# Patient Record
Sex: Female | Born: 1965 | Race: White | Hispanic: No | Smoking: Current every day smoker
Health system: Southern US, Community
[De-identification: ages and names within clinical notes are randomized; demographics above are authoritative.]

## PROBLEM LIST (undated history)

## (undated) DIAGNOSIS — F32A Depression, unspecified: Secondary | ICD-10-CM

## (undated) DIAGNOSIS — IMO0001 Reserved for inherently not codable concepts without codable children: Secondary | ICD-10-CM

## (undated) DIAGNOSIS — J302 Other seasonal allergic rhinitis: Secondary | ICD-10-CM

## (undated) DIAGNOSIS — I10 Essential (primary) hypertension: Secondary | ICD-10-CM

## (undated) DIAGNOSIS — F419 Anxiety disorder, unspecified: Secondary | ICD-10-CM

## (undated) HISTORY — DX: Reserved for inherently not codable concepts without codable children: IMO0001

## (undated) HISTORY — PX: DILATION AND CURETTAGE OF UTERUS: SHX78

---

## 1998-12-19 ENCOUNTER — Encounter: Payer: Self-pay | Admitting: Emergency Medicine

## 1998-12-19 ENCOUNTER — Emergency Department (HOSPITAL_COMMUNITY): Admission: EM | Admit: 1998-12-19 | Discharge: 1998-12-19 | Payer: Self-pay | Admitting: Emergency Medicine

## 2000-10-27 ENCOUNTER — Ambulatory Visit (HOSPITAL_COMMUNITY): Admission: RE | Admit: 2000-10-27 | Discharge: 2000-10-27 | Payer: Self-pay | Admitting: Family Medicine

## 2000-10-27 ENCOUNTER — Encounter: Payer: Self-pay | Admitting: Family Medicine

## 2000-11-01 ENCOUNTER — Ambulatory Visit (HOSPITAL_COMMUNITY): Admission: RE | Admit: 2000-11-01 | Discharge: 2000-11-01 | Payer: Self-pay | Admitting: Family Medicine

## 2000-11-01 ENCOUNTER — Encounter: Payer: Self-pay | Admitting: Family Medicine

## 2000-11-25 ENCOUNTER — Ambulatory Visit (HOSPITAL_COMMUNITY): Admission: RE | Admit: 2000-11-25 | Discharge: 2000-11-25 | Payer: Self-pay | Admitting: Family Medicine

## 2000-11-25 ENCOUNTER — Encounter: Payer: Self-pay | Admitting: Family Medicine

## 2001-01-20 ENCOUNTER — Encounter: Payer: Self-pay | Admitting: Gastroenterology

## 2001-01-20 ENCOUNTER — Ambulatory Visit (HOSPITAL_COMMUNITY): Admission: RE | Admit: 2001-01-20 | Discharge: 2001-01-20 | Payer: Self-pay | Admitting: Gastroenterology

## 2001-01-24 ENCOUNTER — Ambulatory Visit (HOSPITAL_COMMUNITY): Admission: RE | Admit: 2001-01-24 | Discharge: 2001-01-24 | Payer: Self-pay | Admitting: Gastroenterology

## 2001-01-24 ENCOUNTER — Encounter: Payer: Self-pay | Admitting: Gastroenterology

## 2001-08-09 ENCOUNTER — Ambulatory Visit (HOSPITAL_COMMUNITY): Admission: RE | Admit: 2001-08-09 | Discharge: 2001-08-09 | Payer: Self-pay | Admitting: Gastroenterology

## 2001-08-09 ENCOUNTER — Encounter: Payer: Self-pay | Admitting: Gastroenterology

## 2001-08-19 ENCOUNTER — Ambulatory Visit (HOSPITAL_BASED_OUTPATIENT_CLINIC_OR_DEPARTMENT_OTHER): Admission: RE | Admit: 2001-08-19 | Discharge: 2001-08-19 | Payer: Self-pay | Admitting: Obstetrics and Gynecology

## 2002-02-13 ENCOUNTER — Ambulatory Visit (HOSPITAL_COMMUNITY): Admission: RE | Admit: 2002-02-13 | Discharge: 2002-02-13 | Payer: Self-pay | Admitting: Obstetrics and Gynecology

## 2002-02-13 ENCOUNTER — Encounter: Payer: Self-pay | Admitting: Obstetrics and Gynecology

## 2002-02-28 ENCOUNTER — Ambulatory Visit (HOSPITAL_COMMUNITY): Admission: RE | Admit: 2002-02-28 | Discharge: 2002-02-28 | Payer: Self-pay | Admitting: Obstetrics and Gynecology

## 2002-02-28 ENCOUNTER — Encounter: Payer: Self-pay | Admitting: Obstetrics and Gynecology

## 2002-06-29 HISTORY — PX: CARPAL TUNNEL RELEASE: SHX101

## 2002-07-19 ENCOUNTER — Inpatient Hospital Stay (HOSPITAL_COMMUNITY): Admission: AD | Admit: 2002-07-19 | Discharge: 2002-07-23 | Payer: Self-pay | Admitting: Obstetrics and Gynecology

## 2002-07-20 ENCOUNTER — Encounter (INDEPENDENT_AMBULATORY_CARE_PROVIDER_SITE_OTHER): Payer: Self-pay | Admitting: *Deleted

## 2002-08-31 ENCOUNTER — Other Ambulatory Visit: Admission: RE | Admit: 2002-08-31 | Discharge: 2002-08-31 | Payer: Self-pay | Admitting: Obstetrics and Gynecology

## 2003-09-03 ENCOUNTER — Other Ambulatory Visit: Admission: RE | Admit: 2003-09-03 | Discharge: 2003-09-03 | Payer: Self-pay | Admitting: Obstetrics and Gynecology

## 2004-09-03 ENCOUNTER — Other Ambulatory Visit: Admission: RE | Admit: 2004-09-03 | Discharge: 2004-09-03 | Payer: Self-pay | Admitting: Obstetrics and Gynecology

## 2005-09-11 ENCOUNTER — Other Ambulatory Visit: Admission: RE | Admit: 2005-09-11 | Discharge: 2005-09-11 | Payer: Self-pay | Admitting: Obstetrics and Gynecology

## 2006-06-22 ENCOUNTER — Emergency Department (HOSPITAL_COMMUNITY): Admission: EM | Admit: 2006-06-22 | Discharge: 2006-06-22 | Payer: Self-pay | Admitting: Emergency Medicine

## 2007-06-30 HISTORY — PX: ENDOMETRIAL ABLATION: SHX621

## 2007-07-15 ENCOUNTER — Ambulatory Visit (HOSPITAL_COMMUNITY): Admission: RE | Admit: 2007-07-15 | Discharge: 2007-07-15 | Payer: Self-pay | Admitting: Obstetrics and Gynecology

## 2007-11-25 ENCOUNTER — Encounter: Admission: RE | Admit: 2007-11-25 | Discharge: 2007-11-25 | Payer: Self-pay | Admitting: Obstetrics and Gynecology

## 2008-01-26 ENCOUNTER — Encounter: Admission: RE | Admit: 2008-01-26 | Discharge: 2008-01-26 | Payer: Self-pay | Admitting: Obstetrics and Gynecology

## 2008-01-26 ENCOUNTER — Encounter (INDEPENDENT_AMBULATORY_CARE_PROVIDER_SITE_OTHER): Payer: Self-pay | Admitting: Radiology

## 2008-07-13 ENCOUNTER — Encounter: Admission: RE | Admit: 2008-07-13 | Discharge: 2008-07-13 | Payer: Self-pay | Admitting: Obstetrics and Gynecology

## 2009-03-14 ENCOUNTER — Encounter: Admission: RE | Admit: 2009-03-14 | Discharge: 2009-03-14 | Payer: Self-pay | Admitting: Obstetrics and Gynecology

## 2010-04-25 ENCOUNTER — Encounter: Admission: RE | Admit: 2010-04-25 | Discharge: 2010-04-25 | Payer: Self-pay | Admitting: Obstetrics and Gynecology

## 2010-11-11 NOTE — Op Note (Signed)
NAMEKALLIOPE, RIESEN              ACCOUNT NO.:  [id number]   MEDICAL RECORD NO.:  [id number]          PATIENT TYPE:  AMB   LOCATION:  SDC                           FACILITY:  [facility]   PHYSICIAN:  Zenaida Niece, M.D.DATE OF BIRTH:  Oct 16, 1965   DATE OF PROCEDURE:  07/15/2007  DATE OF DISCHARGE:                               OPERATIVE REPORT   PREOPERATIVE DIAGNOSIS:  Menorrhagia.   POSTOPERATIVE DIAGNOSIS:  Menorrhagia.   PROCEDURE:  Hysteroscopy and NovaSure endometrial ablation.   SURGEON:  Zenaida Niece, M.D.   ANESTHESIA:  Monitored anesthesia care and paracervical block.   FINDINGS:  She had a normal, but large uterine cavity.  The NovaSure  device used a uterine depth of 6.5 cm, a width of 4.4 cm and used 157  watts for 70 seconds.   SPECIMENS:  None.   ESTIMATED BLOOD LOSS:  Minimal.   COMPLICATIONS:  None.   PROCEDURE IN DETAIL:  The patient was taken to the operating room and  placed in the dorsal supine position.  She was given IV sedation and  placed in mobile stirrups.  Perineum and vagina were then prepped and  draped in the usual sterile fashion and bladder drained with a latex-  free catheter.  A Graves speculum was inserted into the vagina and the  cervix well exposed.  The anterior lip of the cervix was grasped with a  single-tooth tenaculum.  Deep paracervical block was then performed with  a total of 16 mL of 2% plain lidocaine.  Uterus then sounded to 11.5 cm.  Cervix was gradually dilated to accommodate a size 19 dilator.  The  observer hysteroscope was inserted.  Suboptimal visualization was  achieved, as it was difficult to get good pressure, as fluid just kept  running out of the cervix.  However, no intrauterine lesions were seen.  The cavity looked essentially normal.  The hysteroscope was removed.  The cervix measured 5 cm with the size 7 Hegar dilator and the cervix  then easily dilated to the size 7 and then the size 8 Hegar dilator.  The  NovaSure device was then easily inserted and appropriately deployed.  CO2 test passed and endometrial ablation was performed with the above-  mentioned settings without complications.  The device was then removed  and found to be intact.  Hysteroscopy was again performed and revealed  less endometrial tissue that was slightly brownish.  Again, no  endometrial lesions were seen.  The hysteroscope was then removed.  The single-tooth tenaculum was removed from the cervix and bleeding  controlled with pressure.  All instruments were then removed from the  vagina.  The patient was taken down from stirrups.  She was taken to the  recovery room in stable condition, after tolerating the procedure well.  Counts were correct.      Zenaida Niece, M.D.  Electronically Signed     TDM/MEDQ  D:  07/15/2007  T:  07/15/2007  Job:  [id number]

## 2010-11-14 NOTE — Op Note (Signed)
NAME:  Krista Davis, Krista Davis                        ACCOUNT NO.:  [id number]   MEDICAL RECORD NO.:  [id number]                   PATIENT TYPE:  INP   LOCATION:  9325                                 FACILITY:  [facility]   PHYSICIAN:  Zenaida Niece, M.D.             DATE OF BIRTH:  22-Apr-1966   DATE OF PROCEDURE:  07/20/2002  DATE OF DISCHARGE:                                 OPERATIVE REPORT   PREOPERATIVE DIAGNOSES:  1. Intrauterine pregnancy at 39 weeks.  2. Arrest of descent.  3. Advanced maternal age.  4. Desires surgical sterility.   POSTOPERATIVE DIAGNOSES:  1. Intrauterine pregnancy at 39 weeks.  2. Arrest of descent.  3. Advanced maternal age.  4. Desires surgical sterility.   PROCEDURE:  1. Primary low transverse cesarean section.  2. Bilateral partial salpingectomy.   SURGEON:  Zenaida Niece, M.D.   ANESTHESIA:  Epidural.   ESTIMATED BLOOD LOSS:  800 mL.   FINDINGS:  The patient had normal anatomy except for the uterus was slightly  enlarged with a couple palpable fibroids.  The lower uterine segment was  fairly thick.  She delivered a viable female infant with Apgars of 8 and 9  that weighed 7 pounds 3 ounces.   PROCEDURE IN DETAIL:  The patient was taken to the operating room and placed  in the dorsal supine position with left lateral tilt.  Her previously placed  epidural was dosed appropriately.  Abdomen was prepped and draped in the  usual sterile fashion.  The level of her anesthesia was found to be adequate  and her abdomen was entered via a standard Pfannenstiel incision.  Vesicouterine peritoneum was incised and the bladder flap created digitally.  A 4 cm transverse incision was made in the fairly thick lower uterine  segment.  Once the endometrial cavity was entered, the incision was extended  bilaterally with bandage scissors.  The fetal vertex was then grasped and  delivered through the incision atraumatically.  Mouth and nares were  suctioned and  the baby was noted to be in the OP position.  A nuchal cord x1  was reduced and the remainder of the infant delivered atraumatically.  Cord  was doubly clamped and cut and the infant handed to the waiting pediatric  team.  Cord blood and segment of cord for gas were obtained and the placenta  delivered spontaneously.  The uterus was wiped dry with a clean lap pad.  All clots and debris removed.  The uterine incision was inspected and found  to be free of extensions.  It was then closed in one layer being a running  locking layer with number 1 chromic with adequate hemostasis.  Bleeding from  serosal edges was controlled with electrocautery.  A lap pad was placed over  the incision and attention was turned to the tubal ligation.   Both fallopian tubes were identified and traced to their fimbriated ends.  A  mid portion of each tube was grasped with a Babcock clamp.  A knuckle of  tube was ligated on each side with 0 plain gut suture.  The knuckle of tube  was then sharply removed.  On each side both tubal ostia were identified and  the stumps were hemostatic.  The uterine incision was again inspected and  found to be hemostatic.  The subfascial space was then irrigated and made  hemostatic with electrocautery.  Fascia was closed in a running fashion  starting at both ends and meeting in the middle with 0 Vicryl.  Subcutaneous  tissue was irrigated and made hemostatic with electrocautery.  Skin was  closed with staples and a sterile dressing.  The patient tolerated the  procedure well and was taken to the recovery room in stable condition.  Counts were correct x2 and she received Ancef 2 g after cord clamp.                                               Zenaida Niece, M.D.    TDM/MEDQ  D:  07/20/2002  T:  07/20/2002  Job:  [id number]

## 2010-11-14 NOTE — Discharge Summary (Signed)
NAME:  Krista Davis, Krista Davis                        ACCOUNT NO.:  [id number]   MEDICAL RECORD NO.:  [id number]                   PATIENT TYPE:  INP   LOCATION:  9325                                 [facility]ACILITY:  [facility]   PHYSICIAN:  Zenaida Niece, M.D.             DATE OF BIRTH:  1965-12-03   DATE OF ADMISSION:  07/19/2002  DATE OF DISCHARGE:  07/23/2002                                 DISCHARGE SUMMARY   ADMISSION DIAGNOSES:  Intrauterine pregnancy at 39 weeks. Desires surgical  sterility. Advanced maternal age.   DISCHARGE DIAGNOSES:  Intrauterine pregnancy at 39 weeks. Arrested descent.  Desired surgical sterility. Advanced maternal age.   PROCEDURE:  On July 20, 2002 she had a primary low transverse Cesarean  section with bilateral partial salpingectomy.   HISTORY OF PRESENT ILLNESS:  The patient is a 45 year old white female,  Gravida 2, Para 1/0/0/1 with an EGA of [redacted] weeks by an last menstrual period  consistent with a nine week ultrasound with a due date of July 26, 2002,  who presents with spontaneous rupture of membranes at approximately 0230 on  the day of admission. She was seen for a routine visit. Vaginal examination  in the office was 3, 50, minus two and during the examination, she had  spontaneous rupture of membranes with clear fluid. She was having occasional  contractions and some spotting and good fetal movement. Prenatal care,  advanced maternal age with an amniocentesis with 57-6 XX carrier type, carpal  tunnel syndrome.   PRENATAL LABORATORY DATA:  Blood type A+ with a negative antibody screen.  RPR non-reactive. Rubella immune. Hepatitis B surface antigen. Negative HIV.  Negative Gonorrhea and Chlamydia. Negative triple screen. One in 32 risk for  trisomy 21. One hour Glucola 177. A 3 hour GTT 85, 199, 126, and 142. Group  B strep is negative.   PAST OB HISTORY:  In 1991 vaginal delivery at 40 weeks, 7 pounds and 2  ounces. No complications.   GYN  HISTORY:  Abnormal PAP smear prior to 1991 and normal since then.   PAST MEDICAL HISTORY:  History of irritable bowel syndrome and  gastroesophageal reflux disease. History of migraine headaches.   PAST SURGICAL HISTORY:  Hysteroscopy with resection of an endometrial  nodule.   PHYSICAL EXAMINATION:  VITAL SIGNS: Afebrile with stable vital signs. Fetal  heart tracing reactive with irregular contractions.  ABDOMEN: Gravid with an estimated fetal weight of 8 pounds.  GU: Vaginal examination 3, 50, minus 2, vertex presentation and adequate  pelvis. In the hospital, I performed an amniotomy which revealed clear  fluid.   HOSPITAL COURSE:  The patient was admitted and contracted on her own. She  then required Pitocin to enter active labor. She entered active labor and  received an epidural. She progressed to complete and pushed for two hours.  Fetal heart tracing remained reassuring. She was unable to bring the baby  past +  1 station and thus had arrest of descent. She did have a temperature  to 101 just prior to delivery and received one dose of Unasyn. On the  morning of July 20, 2002, she underwent a primary low transverse Cesarean  section with tubal ligation under epidural anesthesia. Estimated blood loss  800 cc. She had normal anatomy with a thick lower uterine segment. She  delivered a viable female infant with Apgar's of 8 and 9 and weight 7 pounds  and 3 ounces. There were several small palpable myomas. Postoperative she  did very well. She was rapidly able to ambulate and tolerate a diet and  remained afebrile. She breast fed her baby without complications.  Preoperative  hemoglobin was 12.6 and postoperative 9.1. On the morning of  postoperative day three, she was felt to be stable enough for discharge to  home. At that time, her staples were removed and steri-strips applied.   DISCHARGE INSTRUCTIONS:  Regular diet, pelvic rest, and no strenuous  activity. She is given our  discharge pamphlet.   FOLLOW UP:  In approximately ten days for incision check.   MEDICATIONS:  Percocet #3 1-2 by mouth every four to six hours as needed  pain and Motrin as needed.                                               Zenaida Niece, M.D.    TDM/MEDQ  D:  07/23/2002  T:  07/24/2002  Job:  [id number]

## 2011-03-19 LAB — CBC
MCHC: 34.4
Platelets: 235
RDW: 15.3

## 2011-07-10 ENCOUNTER — Other Ambulatory Visit: Payer: Self-pay | Admitting: Obstetrics and Gynecology

## 2011-07-10 DIAGNOSIS — Z1231 Encounter for screening mammogram for malignant neoplasm of breast: Secondary | ICD-10-CM

## 2011-07-28 ENCOUNTER — Ambulatory Visit
Admission: RE | Admit: 2011-07-28 | Discharge: 2011-07-28 | Disposition: A | Discharge status: Home / Self Care | Payer: BC Managed Care – PPO | Source: Ambulatory Visit | Attending: Obstetrics and Gynecology | Admitting: Obstetrics and Gynecology

## 2011-07-28 DIAGNOSIS — Z1231 Encounter for screening mammogram for malignant neoplasm of breast: Secondary | ICD-10-CM

## 2012-08-19 ENCOUNTER — Other Ambulatory Visit: Payer: Self-pay | Admitting: Obstetrics and Gynecology

## 2012-08-19 DIAGNOSIS — Z1231 Encounter for screening mammogram for malignant neoplasm of breast: Secondary | ICD-10-CM

## 2012-09-13 ENCOUNTER — Ambulatory Visit
Admission: RE | Admit: 2012-09-13 | Discharge: 2012-09-13 | Disposition: A | Discharge status: Home / Self Care | Payer: BC Managed Care – PPO | Source: Ambulatory Visit | Attending: Obstetrics and Gynecology | Admitting: Obstetrics and Gynecology

## 2012-09-13 DIAGNOSIS — Z1231 Encounter for screening mammogram for malignant neoplasm of breast: Secondary | ICD-10-CM

## 2013-03-21 ENCOUNTER — Ambulatory Visit: Payer: Self-pay | Admitting: Family Medicine

## 2013-03-22 ENCOUNTER — Ambulatory Visit (INDEPENDENT_AMBULATORY_CARE_PROVIDER_SITE_OTHER): Payer: BC Managed Care – PPO | Admitting: Family Medicine

## 2013-03-22 ENCOUNTER — Encounter: Payer: Self-pay | Admitting: Family Medicine

## 2013-03-22 VITALS — BP 130/90 | Temp 99.0°F | Ht 60.5 in | Wt 148.4 lb

## 2013-03-22 DIAGNOSIS — M25519 Pain in unspecified shoulder: Secondary | ICD-10-CM

## 2013-03-22 DIAGNOSIS — R05 Cough: Secondary | ICD-10-CM

## 2013-03-22 DIAGNOSIS — M25511 Pain in right shoulder: Secondary | ICD-10-CM

## 2013-03-22 MED ORDER — CEFPROZIL 500 MG PO TABS: 500.0000 mg | ORAL_TABLET | Freq: Two times a day (BID) | ORAL | Status: DC

## 2013-03-22 MED ORDER — DICLOFENAC SODIUM 75 MG PO TBEC: 75.0000 mg | DELAYED_RELEASE_TABLET | Freq: Two times a day (BID) | ORAL | Status: DC

## 2013-03-22 NOTE — Progress Notes (Signed)
  Subjective:    Patient ID: Krista Davis, female    DOB: [birth year], 47 y.o.   MRN: [id number]  Cough This is a new problem. The current episode started more than 1 month ago. Associated symptoms include nasal congestion.   Would like to quit smoking. Three-quarters pack per day Cough started two three months.  Cough more aggravating in the morning.  Wakes up with congestion in the morn.  Zyrtec d takes regularly this time of yr.  Pain minimally present  indegestion intermittently.reflux at times. Nausea at times.  Increased stress at thome,  Some producitve.  Quit smoking for one yr last preg.  Several yrs quit  Pain in shoulder two to three months. ibu all day long, recalls no sudden injury.   Review of Systems  Respiratory: Positive for cough.    Also having right shoulder pain for 3 months.    Objective:   Physical Exam Alert no acute distress. HEENT slight nasal congestion. Lungs bronchial cough during exam no wheezes no crackles no tachypnea heart regular rate and rhythm. Positive nasal discharge is noted.       Assessment & Plan:  Impression subacute bronchitis discussed. #2 chronic cough 3 months and smoker. #3 shoulder pain persisting despite over-the-counter anti-inflammatories. #4 smoking cessation discussed at length. Plan 25 minutes spent most in discussion. Cefzil twice a day 10 days. Three-month course of Chantix discussed and recommended side effects benefits discussed. Chest x-ray. Shoulder x-ray. WSL

## 2013-03-23 ENCOUNTER — Ambulatory Visit (HOSPITAL_COMMUNITY)
Admission: RE | Admit: 2013-03-23 | Discharge: 2013-03-23 | Disposition: A | Discharge status: Home / Self Care | Payer: BC Managed Care – PPO | Source: Ambulatory Visit | Attending: Family Medicine | Admitting: Family Medicine

## 2013-03-23 DIAGNOSIS — R05 Cough: Secondary | ICD-10-CM | POA: Insufficient documentation

## 2013-03-23 DIAGNOSIS — R059 Cough, unspecified: Secondary | ICD-10-CM | POA: Insufficient documentation

## 2013-03-27 MED ORDER — AZITHROMYCIN 250 MG PO TABS: ORAL_TABLET | ORAL | Status: DC

## 2013-03-27 NOTE — Addendum Note (Signed)
Addended by: Margaretha Sheffield on: 03/27/2013 11:59 AM   Modules accepted: Orders

## 2013-04-18 ENCOUNTER — Telehealth: Payer: Self-pay | Admitting: Family Medicine

## 2013-04-18 DIAGNOSIS — M25511 Pain in right shoulder: Secondary | ICD-10-CM

## 2013-04-18 NOTE — Telephone Encounter (Signed)
Referral ordered in system. Patient was notified.

## 2013-04-18 NOTE — Telephone Encounter (Signed)
Let's do 

## 2013-04-18 NOTE — Addendum Note (Signed)
Addended by: Dereck Ligas on: 04/18/2013 02:41 PM   Modules accepted: Orders

## 2013-04-18 NOTE — Telephone Encounter (Signed)
Patient was in on 9/24 with shoulder pain and cough as her primary visit. But still having pain in her shoulder and wants a referral to a specialist.Days for appt referred are mon,tue,wed,thur. mornings.

## 2013-04-24 ENCOUNTER — Other Ambulatory Visit: Payer: Self-pay | Admitting: Family Medicine

## 2013-05-02 ENCOUNTER — Ambulatory Visit (INDEPENDENT_AMBULATORY_CARE_PROVIDER_SITE_OTHER): Payer: BC Managed Care – PPO | Admitting: Orthopedic Surgery

## 2013-05-02 ENCOUNTER — Encounter: Payer: Self-pay | Admitting: Orthopedic Surgery

## 2013-05-02 VITALS — BP 135/91 | Ht 60.5 in | Wt 155.0 lb

## 2013-05-02 DIAGNOSIS — M67919 Unspecified disorder of synovium and tendon, unspecified shoulder: Secondary | ICD-10-CM

## 2013-05-02 DIAGNOSIS — M75101 Unspecified rotator cuff tear or rupture of right shoulder, not specified as traumatic: Secondary | ICD-10-CM | POA: Insufficient documentation

## 2013-05-02 NOTE — Progress Notes (Signed)
  Subjective:    Patient ID: Krista Davis, female    DOB: 1965-10-25, 47 y.o.   MRN: [id number]  Shoulder Pain  The pain is present in the right shoulder. This is a new problem. The current episode started more than 1 month ago. There has been no history of extremity trauma. The problem occurs constantly. The problem has been gradually worsening. The quality of the pain is described as sharp. Associated symptoms include a limited range of motion and stiffness. Pertinent negatives include no fever, itching, joint locking or joint swelling.      Review of Systems  Constitutional: Negative.  Negative for fever.  Eyes: Negative.   Respiratory: Positive for apnea, cough and shortness of breath.   Cardiovascular: Negative.   Gastrointestinal:       Heartburn  Endocrine: Negative.   Genitourinary: Negative.   Musculoskeletal: Positive for stiffness.  Skin: Negative for itching.  Allergic/Immunologic: Positive for environmental allergies.  Neurological:       Numbness and tingling       Objective:   Physical Exam BP 135/91  Ht 5' 0.5" (1.537 m)  Wt 155 lb (70.308 kg)  BMI 29.76 kg/m2  General appearance is normal, the patient is alert and oriented x3 with normal mood and affect. Ambulation is normal  Right Shoulder Exam   Tenderness  Right shoulder tenderness location: Posterior lateral joint line.  Range of Motion  Active Abduction: abnormal  Passive Abduction: abnormal  Extension: abnormal  Forward Flexion: abnormal  External Rotation: abnormal  Internal Rotation 0 degrees: abnormal   Muscle Strength  Abduction: 5/5  Internal Rotation: 5/5  External Rotation: 5/5  Supraspinatus: 5/5  Subscapularis: 5/5  Biceps: 5/5   Tests  Apprehension: positive Cross Arm: negative Drop Arm: negative Impingement: positive Sulcus: absent  Other  Sensation: normal Pulse: present  Comments:  Cervical lymph nodes are normal bilaterally deep tendon reflexes are equal  bilaterally   Left Shoulder Exam  Left shoulder exam is normal.  Range of Motion  The patient has normal left shoulder ROM.  Muscle Strength  The patient has normal left shoulder strength.  Tests  Apprehension: negative  Other  Erythema: absent Scars: absent Sensation: normal Pulse: present       The x-ray was done prior to the office visit she has a type II acromion and otherwise normal shoulder     Assessment & Plan:   Encounter Diagnosis  Name Primary?  . Rotator cuff syndrome of right shoulder Yes    Shoulder Injection Procedure Note   Pre-operative Diagnosis: right  RC Syndrome  Post-operative Diagnosis: same  Indications: pain   Anesthesia: ethyl chloride   Procedure Details   Verbal consent was obtained for the procedure. The shoulder was prepped withalcohol and the skin was anesthetized. A 20 gauge needle was advanced into the subacromial space through posterior approach without difficulty  The space was then injected with 3 ml 1% lidocaine and 1 ml of depomedrol. The injection site was cleansed with isopropyl alcohol and a dressing was applied.  Complications:  None; patient tolerated the procedure well.   Recommend continued anti-inflammatories and had a home exercise program for rotator cuff strengthening   Followup when necessary

## 2013-05-02 NOTE — Patient Instructions (Signed)
Go to [facility] to pick up exercise bands  Joint Injection  Care After  Refer to this sheet in the next few days. These instructions provide you with information on caring for yourself after you have had a joint injection. Your caregiver also may give you more specific instructions. Your treatment has been planned according to current medical practices, but problems sometimes occur. Call your caregiver if you have any problems or questions after your procedure.  After any type of joint injection, it is not uncommon to experience:  Soreness, swelling, or bruising around the injection site.  Mild numbness, tingling, or weakness around the injection site caused by the numbing medicine used before or with the injection. It also is possible to experience the following effects associated with the specific agent after injection:  Iodine-based contrast agents:  Allergic reaction (itching, hives, widespread redness, and swelling beyond the injection site).  Corticosteroids (These effects are rare.):  Allergic reaction.  Increased blood sugar levels (If you have diabetes and you notice that your blood sugar levels have increased, notify your caregiver).  Increased blood pressure levels.  Mood swings.  Hyaluronic acid in the use of viscosupplementation.  Temporary heat or redness.  Temporary rash and itching.  Increased fluid accumulation in the injected joint. These effects all should resolve within a day after your procedure.  HOME CARE INSTRUCTIONS  Limit yourself to light activity the day of your procedure. Avoid lifting heavy objects, bending, stooping, or twisting.  Take prescription or over-the-counter pain medication as directed by your caregiver.  You may apply ice to your injection site to reduce pain and swelling the day of your procedure. Ice may be applied 3-4 times:  Put ice in a plastic bag.  Place a towel between your skin and the bag.  Leave the ice on for no longer than  15-20 minutes each time. SEEK IMMEDIATE MEDICAL CARE IF:  Pain and swelling get worse rather than better or extend beyond the injection site.  Numbness does not go away.  Blood or fluid continues to leak from the injection site.  You have chest pain.  You have swelling of your face or tongue.  You have trouble breathing or you become dizzy.  You develop a fever, chills, or severe tenderness at the injection site that last longer than 1 day. MAKE SURE YOU:  Understand these instructions.  Watch your condition.  Get help right away if you are not doing well or if you get worse. Document Released: 02/26/2011 Document Revised: 09/07/2011 Document Reviewed: 02/26/2011  [facility] Patient Information 2014 [place], [place].

## 2013-05-04 ENCOUNTER — Other Ambulatory Visit: Payer: Self-pay

## 2013-05-24 ENCOUNTER — Ambulatory Visit (INDEPENDENT_AMBULATORY_CARE_PROVIDER_SITE_OTHER): Payer: BC Managed Care – PPO | Admitting: Family Medicine

## 2013-05-24 ENCOUNTER — Encounter: Payer: Self-pay | Admitting: Family Medicine

## 2013-05-24 VITALS — BP 120/80 | Temp 97.7°F | Ht 60.5 in | Wt 157.5 lb

## 2013-05-24 DIAGNOSIS — J029 Acute pharyngitis, unspecified: Secondary | ICD-10-CM

## 2013-05-24 DIAGNOSIS — B9789 Other viral agents as the cause of diseases classified elsewhere: Secondary | ICD-10-CM

## 2013-05-24 DIAGNOSIS — J019 Acute sinusitis, unspecified: Secondary | ICD-10-CM

## 2013-05-24 DIAGNOSIS — B349 Viral infection, unspecified: Secondary | ICD-10-CM

## 2013-05-24 LAB — POCT RAPID STREP A (OFFICE): Rapid Strep A Screen: NEGATIVE

## 2013-05-24 MED ORDER — HYDROCODONE-HOMATROPINE 5-1.5 MG/5ML PO SYRP: 5.0000 mL | ORAL_SOLUTION | Freq: Four times a day (QID) | ORAL | Status: DC | PRN

## 2013-05-24 MED ORDER — CLARITHROMYCIN 500 MG PO TABS: 500.0000 mg | ORAL_TABLET | Freq: Two times a day (BID) | ORAL | Status: AC

## 2013-05-24 NOTE — Progress Notes (Signed)
   Subjective:    Patient ID: Krista Davis, female    DOB: 1965/09/22, 47 y.o.   MRN: [id number]  Sore Throat  This is a new problem. The current episode started in the past 7 days. The problem has been unchanged. Neither side of throat is experiencing more pain than the other. The maximum temperature recorded prior to her arrival was 101 - 101.9 F. The fever has been present for 1 to 2 days. The pain is at a severity of 7/10. The pain is moderate. Associated symptoms include congestion, coughing, ear pain and headaches. She has tried NSAIDs for the symptoms. The treatment provided mild relief.   Started last Thursday, had chills then fever on Friday. Body aches. Sunday pm a little better more energy. Sun night congestion Progressive Sx today with sinuses PMH prn   Review of Systems  HENT: Positive for congestion and ear pain.   Respiratory: Positive for cough.   Neurological: Positive for headaches.       Objective:   Physical Exam  Nursing note and vitals reviewed. Constitutional: She appears well-developed.  HENT:  Head: Normocephalic.  Nose: Nose normal.  Mouth/Throat: Oropharynx is clear and moist. No oropharyngeal exudate.  Neck: Neck supple.  Cardiovascular: Normal rate and normal heart sounds.   No murmur heard. Pulmonary/Chest: Effort normal and breath sounds normal. She has no wheezes.  Lymphadenopathy:    She has no cervical adenopathy.  Skin: Skin is warm and dry.          Assessment & Plan:  Viral syndrome with secondary sinusitis Biaxin as directed if high fevers progressive symptoms or worse followup

## 2013-05-25 LAB — STREP A DNA PROBE: GASP: NEGATIVE

## 2013-06-19 ENCOUNTER — Other Ambulatory Visit: Payer: Self-pay | Admitting: Orthopedic Surgery

## 2013-06-20 ENCOUNTER — Encounter (HOSPITAL_BASED_OUTPATIENT_CLINIC_OR_DEPARTMENT_OTHER): Payer: Self-pay | Admitting: *Deleted

## 2013-06-20 NOTE — Progress Notes (Signed)
No labs needed

## 2013-06-27 ENCOUNTER — Encounter (HOSPITAL_BASED_OUTPATIENT_CLINIC_OR_DEPARTMENT_OTHER)
Admission: RE | Disposition: A | Discharge status: Home / Self Care | Payer: Self-pay | Source: Ambulatory Visit | Attending: Orthopedic Surgery

## 2013-06-27 ENCOUNTER — Encounter (HOSPITAL_BASED_OUTPATIENT_CLINIC_OR_DEPARTMENT_OTHER): Payer: BC Managed Care – PPO | Admitting: Anesthesiology

## 2013-06-27 ENCOUNTER — Encounter (HOSPITAL_BASED_OUTPATIENT_CLINIC_OR_DEPARTMENT_OTHER): Payer: Self-pay | Admitting: *Deleted

## 2013-06-27 ENCOUNTER — Ambulatory Visit (HOSPITAL_BASED_OUTPATIENT_CLINIC_OR_DEPARTMENT_OTHER): Payer: BC Managed Care – PPO | Admitting: Anesthesiology

## 2013-06-27 ENCOUNTER — Ambulatory Visit (HOSPITAL_BASED_OUTPATIENT_CLINIC_OR_DEPARTMENT_OTHER)
Admission: RE | Admit: 2013-06-27 | Discharge: 2013-06-27 | Disposition: A | Discharge status: Home / Self Care | Payer: BC Managed Care – PPO | Source: Ambulatory Visit | Attending: Orthopedic Surgery | Admitting: Orthopedic Surgery

## 2013-06-27 DIAGNOSIS — G5600 Carpal tunnel syndrome, unspecified upper limb: Secondary | ICD-10-CM | POA: Insufficient documentation

## 2013-06-27 HISTORY — PX: CARPAL TUNNEL RELEASE: SHX101

## 2013-06-27 HISTORY — DX: Other seasonal allergic rhinitis: J30.2

## 2013-06-27 LAB — POCT HEMOGLOBIN-HEMACUE: Hemoglobin: 14.4 g/dL (ref 12.0–15.0)

## 2013-06-27 SURGERY — CARPAL TUNNEL RELEASE
Anesthesia: General | Site: Wrist | Laterality: Right

## 2013-06-27 MED ORDER — CHLORHEXIDINE GLUCONATE 4 % EX LIQD: 60.0000 mL | Freq: Once | CUTANEOUS | Status: DC

## 2013-06-27 MED ORDER — KETOROLAC TROMETHAMINE 30 MG/ML IJ SOLN
INTRAMUSCULAR | Status: DC | PRN
  Administered 2013-06-27: 30 mg via INTRAVENOUS

## 2013-06-27 MED ORDER — HYDROMORPHONE HCL PF 1 MG/ML IJ SOLN
0.2500 mg | INTRAMUSCULAR | Status: DC | PRN
  Administered 2013-06-27: 0.5 mg via INTRAVENOUS

## 2013-06-27 MED ORDER — LIDOCAINE HCL (CARDIAC) 20 MG/ML IV SOLN
INTRAVENOUS | Status: DC | PRN
  Administered 2013-06-27: 50 mg via INTRAVENOUS

## 2013-06-27 MED ORDER — LACTATED RINGERS IV SOLN
INTRAVENOUS | Status: DC
  Administered 2013-06-27 (×2): via INTRAVENOUS

## 2013-06-27 MED ORDER — PROPOFOL 10 MG/ML IV EMUL
INTRAVENOUS | Status: AC
  Filled 2013-06-27: qty 50

## 2013-06-27 MED ORDER — BUPIVACAINE HCL (PF) 0.25 % IJ SOLN
INTRAMUSCULAR | Status: DC | PRN
  Administered 2013-06-27: 7 mL

## 2013-06-27 MED ORDER — OXYCODONE HCL 5 MG PO TABS
5.0000 mg | ORAL_TABLET | Freq: Once | ORAL | Status: AC | PRN
  Administered 2013-06-27: 5 mg via ORAL

## 2013-06-27 MED ORDER — MIDAZOLAM HCL 2 MG/2ML IJ SOLN
INTRAMUSCULAR | Status: AC
  Filled 2013-06-27: qty 2

## 2013-06-27 MED ORDER — OXYCODONE HCL 5 MG PO TABS
ORAL_TABLET | ORAL | Status: AC
  Filled 2013-06-27: qty 1

## 2013-06-27 MED ORDER — DEXAMETHASONE SODIUM PHOSPHATE 10 MG/ML IJ SOLN
INTRAMUSCULAR | Status: DC | PRN
  Administered 2013-06-27: 5 mg via INTRAVENOUS

## 2013-06-27 MED ORDER — HYDROCODONE-ACETAMINOPHEN 5-325 MG PO TABS: 1.0000 | ORAL_TABLET | Freq: Four times a day (QID) | ORAL | Status: DC | PRN

## 2013-06-27 MED ORDER — FENTANYL CITRATE 0.05 MG/ML IJ SOLN
INTRAMUSCULAR | Status: DC | PRN
  Administered 2013-06-27 (×2): 25 ug via INTRAVENOUS
  Administered 2013-06-27: 50 ug via INTRAVENOUS

## 2013-06-27 MED ORDER — ONDANSETRON HCL 4 MG/2ML IJ SOLN
INTRAMUSCULAR | Status: DC | PRN
  Administered 2013-06-27: 4 mg via INTRAVENOUS

## 2013-06-27 MED ORDER — FENTANYL CITRATE 0.05 MG/ML IJ SOLN: 50.0000 ug | INTRAMUSCULAR | Status: DC | PRN

## 2013-06-27 MED ORDER — FENTANYL CITRATE 0.05 MG/ML IJ SOLN
INTRAMUSCULAR | Status: AC
  Filled 2013-06-27: qty 2

## 2013-06-27 MED ORDER — HYDROMORPHONE HCL PF 1 MG/ML IJ SOLN
INTRAMUSCULAR | Status: AC
  Filled 2013-06-27: qty 1

## 2013-06-27 MED ORDER — ONDANSETRON HCL 4 MG/2ML IJ SOLN: 4.0000 mg | Freq: Once | INTRAMUSCULAR | Status: DC | PRN

## 2013-06-27 MED ORDER — CEFAZOLIN SODIUM-DEXTROSE 2-3 GM-% IV SOLR
2.0000 g | INTRAVENOUS | Status: AC
  Administered 2013-06-27: 2 g via INTRAVENOUS

## 2013-06-27 MED ORDER — MIDAZOLAM HCL 5 MG/5ML IJ SOLN
INTRAMUSCULAR | Status: DC | PRN
  Administered 2013-06-27: 2 mg via INTRAVENOUS

## 2013-06-27 MED ORDER — MIDAZOLAM HCL 2 MG/2ML IJ SOLN: 1.0000 mg | INTRAMUSCULAR | Status: DC | PRN

## 2013-06-27 MED ORDER — PROPOFOL 10 MG/ML IV BOLUS
INTRAVENOUS | Status: DC | PRN
  Administered 2013-06-27: 200 mg via INTRAVENOUS

## 2013-06-27 MED ORDER — OXYCODONE HCL 5 MG/5ML PO SOLN: 5.0000 mg | Freq: Once | ORAL | Status: AC | PRN

## 2013-06-27 SURGICAL SUPPLY — 43 items
BLADE SURG 15 STRL LF DISP TIS (BLADE) ×1 IMPLANT
BLADE SURG 15 STRL SS (BLADE) ×2
BNDG CMPR 9X4 STRL LF SNTH (GAUZE/BANDAGES/DRESSINGS) ×1
BNDG COHESIVE 3X5 TAN STRL LF (GAUZE/BANDAGES/DRESSINGS) ×2 IMPLANT
BNDG ESMARK 4X9 LF (GAUZE/BANDAGES/DRESSINGS) ×1 IMPLANT
BNDG GAUZE ELAST 4 BULKY (GAUZE/BANDAGES/DRESSINGS) ×2 IMPLANT
CHLORAPREP W/TINT 26ML (MISCELLANEOUS) ×2 IMPLANT
CORDS BIPOLAR (ELECTRODE) ×2 IMPLANT
COVER MAYO STAND STRL (DRAPES) ×2 IMPLANT
COVER TABLE BACK 60X90 (DRAPES) ×2 IMPLANT
CUFF TOURNIQUET SINGLE 18IN (TOURNIQUET CUFF) ×2 IMPLANT
DRAPE EXTREMITY T 121X128X90 (DRAPE) ×2 IMPLANT
DRAPE SURG 17X23 STRL (DRAPES) ×2 IMPLANT
DRSG KUZMA FLUFF (GAUZE/BANDAGES/DRESSINGS) ×1 IMPLANT
GAUZE XEROFORM 1X8 LF (GAUZE/BANDAGES/DRESSINGS) ×2 IMPLANT
GLOVE BIO SURGEON STRL SZ 6.5 (GLOVE) ×2 IMPLANT
GLOVE BIO SURGEON STRL SZ7.5 (GLOVE) ×1 IMPLANT
GLOVE BIOGEL PI IND STRL 7.0 (GLOVE) IMPLANT
GLOVE BIOGEL PI IND STRL 8 (GLOVE) IMPLANT
GLOVE BIOGEL PI IND STRL 8.5 (GLOVE) ×1 IMPLANT
GLOVE BIOGEL PI INDICATOR 7.0 (GLOVE) ×1
GLOVE BIOGEL PI INDICATOR 8 (GLOVE) ×1
GLOVE BIOGEL PI INDICATOR 8.5 (GLOVE) ×1
GLOVE ECLIPSE 6.5 STRL STRAW (GLOVE) ×1 IMPLANT
GLOVE SURG ORTHO 8.0 STRL STRW (GLOVE) ×2 IMPLANT
GOWN BRE IMP PREV XXLGXLNG (GOWN DISPOSABLE) ×3 IMPLANT
GOWN PREVENTION PLUS XLARGE (GOWN DISPOSABLE) ×2 IMPLANT
NEEDLE 27GAX1X1/2 (NEEDLE) ×1 IMPLANT
NS IRRIG 1000ML POUR BTL (IV SOLUTION) ×2 IMPLANT
PACK BASIN DAY SURGERY FS (CUSTOM PROCEDURE TRAY) ×2 IMPLANT
PAD ABD 8X10 STRL (GAUZE/BANDAGES/DRESSINGS) ×1 IMPLANT
PAD CAST 3X4 CTTN HI CHSV (CAST SUPPLIES) ×1 IMPLANT
PADDING CAST ABS 4INX4YD NS (CAST SUPPLIES) ×1
PADDING CAST ABS COTTON 4X4 ST (CAST SUPPLIES) ×1 IMPLANT
PADDING CAST COTTON 3X4 STRL (CAST SUPPLIES) ×2
SPONGE GAUZE 4X4 12PLY (GAUZE/BANDAGES/DRESSINGS) ×2 IMPLANT
STOCKINETTE 4X48 STRL (DRAPES) ×2 IMPLANT
SUT VICRYL 4-0 PS2 18IN ABS (SUTURE) IMPLANT
SUT VICRYL RAPIDE 4/0 PS 2 (SUTURE) ×2 IMPLANT
SYR BULB 3OZ (MISCELLANEOUS) ×2 IMPLANT
SYR CONTROL 10ML LL (SYRINGE) ×1 IMPLANT
TOWEL OR 17X24 6PK STRL BLUE (TOWEL DISPOSABLE) ×2 IMPLANT
UNDERPAD 30X30 INCONTINENT (UNDERPADS AND DIAPERS) ×2 IMPLANT

## 2013-06-27 NOTE — Brief Op Note (Signed)
06/27/2013  11:31 AM  PATIENT:  Krista Davis  47 y.o. female  PRE-OPERATIVE DIAGNOSIS:  CARPAL TUNNEL SYNDROME RIGHT WRIST   POST-OPERATIVE DIAGNOSIS:  CARPAL TUNNEL SYNDROME RIGHT WRIST   PROCEDURE:  Procedure(s): RIGHT CARPAL TUNNEL RELEASE (Right)  SURGEON:  Surgeon(s) and Role:    * Nicki Reaper, MD - Primary  PHYSICIAN ASSISTANT:   ASSISTANTS: K Martinique Pizzimenti,MD   ANESTHESIA:   local and regional  EBL:  Total I/O In: 1000 [I.V.:1000] Out: -   BLOOD ADMINISTERED:none  DRAINS: none   LOCAL MEDICATIONS USED:  BUPIVICAINE   SPECIMEN:  No Specimen  DISPOSITION OF SPECIMEN:  N/A  COUNTS:  YES  TOURNIQUET:   Total Tourniquet Time Documented: Upper Arm (Right) - 11 minutes Total: Upper Arm (Right) - 11 minutes   DICTATION: .Other Dictation: Dictation Number [id number]  PLAN OF CARE: Discharge to home after PACU  PATIENT DISPOSITION:  PACU - hemodynamically stable.

## 2013-06-27 NOTE — Transfer of Care (Signed)
Immediate Anesthesia Transfer of Care Note  Patient: Krista Davis  Procedure(s) Performed: Procedure(s): RIGHT CARPAL TUNNEL RELEASE (Right)  Patient Location: PACU  Anesthesia Type:General  Level of Consciousness: sedated  Airway & Oxygen Therapy: Patient Spontanous Breathing and Patient connected to face mask oxygen  Post-op Assessment: Report given to PACU RN and Post -op Vital signs reviewed and stable  Post vital signs: Reviewed and stable  Complications: No apparent anesthesia complications

## 2013-06-27 NOTE — Op Note (Signed)
Dictation Number [id number]

## 2013-06-27 NOTE — H&P (Signed)
Krista Davis is a 47 year-old right-hand dominant female, daughter of a former patient. She has had bilateral carpal tunnel syndromes diagnosed ten years ago by Dr. Amanda Pea. She underwent release of her left side after nerve conductions showed a severe carpal tunnel syndrome on that side. She is now complaining of her right side which has not been treated.  She has no history of injury to the hand or neck. She is awakened 2-3 out of 7 nights.  She has no history of diabetes, thyroid problems, arthritis or gout . She has been taking ibuprofen and wearing a brace . She complains of an intermittent almost constant numbness.  She states it is gradually getting worse.  Activity makes it worse.  She has been taking only an occasional ibuprofen. She has had her nerve conductions done revealing right carpal tunnel syndrome with motor delay of 5.0, sensory delay of 2.0, amplitude diminution to 32. Her left side is entirely normal. We have discussed with her the possibility of injection to the carpal canal in an effort to see if this will settle her symptoms down for her.   ALLERGIES:   None.  MEDICATIONS:  Estradiol.  SURGICAL HISTORY:   C-section, carpal tunnel release on her left side.  FAMILY MEDICAL HISTORY:  Negative.  SOCIAL HISTORY:  She does not smoke, quit a month ago.  She drinks socially.  She is married.  [employer] operational officer.  REVIEW OF SYSTEMS:   Positive for glasses, hearing loss, otherwise negative 14 points.  Krista Davis is an 47 y.o. female.   Chief Complaint: CTS Right HPI: see above  Past Medical History  Diagnosis Date  . No abnormality seen   . Seasonal allergies     Past Surgical History  Procedure Laterality Date  . Carpal tunnel release  2004    left  . Cesarean section  2004  . Endometrial ablation  2009  . Dilation and curettage of uterus      History reviewed. No pertinent family history. Social History:  reports that she has been smoking.  She does not have any smokeless  tobacco history on file. She reports that she drinks alcohol. She reports that she does not use illicit drugs.  Allergies: No Known Allergies  Medications Prior to Admission  Medication Sig Dispense Refill  . acetaminophen (TYLENOL) 500 MG tablet Take 500 mg by mouth every 6 (six) hours as needed.      Marland Kitchen estradiol-norethindrone (ACTIVELLA) 1-0.5 MG per tablet Take 1 tablet by mouth daily.      . cetirizine (ZYRTEC) 10 MG tablet Take 10 mg by mouth as needed for allergies.        Results for orders placed during the hospital encounter of 06/27/13 (from the past 48 hour(s))  POCT HEMOGLOBIN-HEMACUE     Status: None   Collection Time    06/27/13 10:07 AM      Result Value Range   Hemoglobin 14.4  12.0 - 15.0 g/dL    No results found.   Pertinent items are noted in HPI.  Blood pressure 137/84, pulse 86, temperature 98.3 F (36.8 C), temperature source Oral, resp. rate 18, height 5' (1.524 m), weight 159 lb 2 oz (72.179 kg), SpO2 98.00%.  General appearance: alert, cooperative and appears stated age Head: Normocephalic, without obvious abnormality Neck: no JVD Resp: clear to auscultation bilaterally Cardio: regular rate and rhythm, S1, S2 normal, no murmur, click, rub or gallop GI: soft, non-tender; bowel sounds normal; no masses,  no organomegaly  Extremities: extremities normal, atraumatic, no cyanosis or edema Pulses: 2+ and symmetric Skin: Skin color, texture, turgor normal. No rashes or lesions Neurologic: Grossly normal Incision/Wound: na  Assessment/Plan RADIOGRAPHS:    X-rays reveal mild degenerative change at the [facility] joint of the thumb, asymptomatic.  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    We have discussed with her the possibility of injection to the carpal canal in an effort to see if this will settle her symptoms down for her. She has declined and would like to have this operated on. The pre, peri and post op  course are discussed along with risks and complications.  She is aware there is no guarantee with surgery, possibility of infection, recurrence, injury to arteries, nerves and tendons, incomplete relief of symptoms and dystrophy.  She is well aware of risks and complications having had the left side done. She is scheduled for right carpal tunnel release as an outpatient under regional anesthesia.   Krista Davis R 06/27/2013, 10:52 AM

## 2013-06-27 NOTE — Anesthesia Procedure Notes (Signed)
Procedure Name: LMA Insertion Performed by: Kaj Vasil W Pre-anesthesia Checklist: Patient identified, Timeout performed, Emergency Drugs available, Suction available and Patient being monitored Patient Re-evaluated:Patient Re-evaluated prior to inductionOxygen Delivery Method: Circle system utilized Preoxygenation: Pre-oxygenation with 100% oxygen Intubation Type: IV induction Ventilation: Mask ventilation without difficulty LMA: LMA inserted LMA Size: 4.0 Number of attempts: 1 Placement Confirmation: breath sounds checked- equal and bilateral and positive ETCO2 Tube secured with: Tape Dental Injury: Teeth and Oropharynx as per pre-operative assessment      

## 2013-06-27 NOTE — Anesthesia Preprocedure Evaluation (Signed)
Anesthesia Evaluation  Patient identified by MRN, date of birth, ID band Patient awake    Reviewed: Allergy & Precautions, H&P , NPO status , Patient's Chart, lab work & pertinent test results  Airway Mallampati: I TM Distance: >3 FB Neck ROM: Full    Dental  (+) Teeth Intact and Dental Advisory Given   Pulmonary Current Smoker,  breath sounds clear to auscultation        Cardiovascular Rhythm:Regular     Neuro/Psych    GI/Hepatic   Endo/Other    Renal/GU      Musculoskeletal   Abdominal   Peds  Hematology   Anesthesia Other Findings   Reproductive/Obstetrics                           Anesthesia Physical Anesthesia Plan  ASA: I  Anesthesia Plan: General   Post-op Pain Management:    Induction: Intravenous  Airway Management Planned:   Additional Equipment:   Intra-op Plan:   Post-operative Plan: Extubation in OR  Informed Consent: I have reviewed the patients History and Physical, chart, labs and discussed the procedure including the risks, benefits and alternatives for the proposed anesthesia with the patient or authorized representative who has indicated his/her understanding and acceptance.   Dental advisory given  Plan Discussed with: CRNA, Anesthesiologist and Surgeon  Anesthesia Plan Comments:         Anesthesia Quick Evaluation

## 2013-06-27 NOTE — Anesthesia Postprocedure Evaluation (Signed)
  Anesthesia Post-op Note  Patient: Krista Davis  Procedure(s) Performed: Procedure(s): RIGHT CARPAL TUNNEL RELEASE (Right)  Patient Location: PACU  Anesthesia Type:General  Level of Consciousness: awake, alert  and oriented  Airway and Oxygen Therapy: Patient Spontanous Breathing  Post-op Pain: mild  Post-op Assessment: Post-op Vital signs reviewed  Post-op Vital Signs: Reviewed  Complications: No apparent anesthesia complications

## 2013-06-28 NOTE — Op Note (Signed)
NAMEBRENNAH, Krista Davis              ACCOUNT NO.:  [id number]  MEDICAL RECORD NO.:  [id number]  LOCATION:                                 FACILITY:  PHYSICIAN:  Cindee Salt, M.D.       DATE OF BIRTH:  21-Feb-1966  DATE OF PROCEDURE:  06/27/2013 DATE OF DISCHARGE:                              OPERATIVE REPORT   PREOPERATIVE DIAGNOSIS:  Carpal tunnel syndrome, right hand.  POSTOPERATIVE DIAGNOSIS:  Carpal tunnel syndrome, right hand.  OPERATION:  Decompression right median nerve.  SURGEON:  Betha Loa MD.  ANESTHESIA:  Forearm based IV regional with local infiltration.  ANESTHESIOLOGIST:  Sheldon Silvan, MD.  HISTORY:  The patient is a 47 year old female with a history of bilateral carpal tunnel.  She has undergone release of the left side in the past and is admitted now for release of the right side. Pre, peri and postoperative course have been discussed along with risks and complications.  He is aware that there is no guarantee with the surgery, possibility of infection, recurrence of injury to arteries, nerves, tendons, incomplete relief of symptoms, and dystrophy.  In the preoperative area the patient is seen, the extremity marked by both patient and surgeon.  Antibiotic given.  DESCRIPTION OF PROCEDURE:  The patient was brought to the operating room, where a forearm-based IV regional anesthetic was carried out without difficulty.  She was prepped using ChloraPrep, supine position, right arm free.  A 3-minute dry time was allowed.  Time-out taken, confirming the patient and procedure.  A longitudinal incision was made in the right palm, carried down through subcutaneous tissue.  Bleeders were electrocauterized.  Palmar fascia was split.  Superficial palmar arch identified.  The flexor tendon to the ring and little finger identified.  To the ulnar side of the median nerve, the carpal retinaculum was incised with sharp dissection.  Right angle and Sewall retractor were placed  between skin and forearm fascia.  The fascia was released.  for approximately 1 and 1.5 to 2 cm in proximal direction under direct vision.  Canal was explored.  Area of compression to the nerve was apparent.  The motor branch entered in the muscle.  No further lesions were identified.  The wound was irrigated.  The skin was then closed with interrupted 4-0 Vicryl Rapide sutures.  Local infiltration with 0.25% Marcaine without epinephrine was given, 7 mL was used.  A sterile compressive dressing was applied with the fingers free. On deflation of the tourniquet, all fingers immediately pinked.  She was taken to the recovery room for observation in satisfactory condition. She will be discharged home to return to the [facility] in 1 week on Vicodin.          ______________________________ Cindee Salt, M.D.     GK/MEDQ  D:  06/27/2013  T:  06/28/2013  Job:  [id number]

## 2013-06-30 ENCOUNTER — Encounter (HOSPITAL_BASED_OUTPATIENT_CLINIC_OR_DEPARTMENT_OTHER): Payer: Self-pay | Admitting: Orthopedic Surgery

## 2013-09-15 ENCOUNTER — Other Ambulatory Visit: Payer: Self-pay

## 2013-09-15 DIAGNOSIS — Z1231 Encounter for screening mammogram for malignant neoplasm of breast: Secondary | ICD-10-CM

## 2013-10-06 ENCOUNTER — Ambulatory Visit
Admission: RE | Admit: 2013-10-06 | Discharge: 2013-10-06 | Disposition: A | Discharge status: Home / Self Care | Payer: BC Managed Care – PPO | Source: Ambulatory Visit

## 2013-10-06 DIAGNOSIS — Z1231 Encounter for screening mammogram for malignant neoplasm of breast: Secondary | ICD-10-CM

## 2015-03-11 ENCOUNTER — Other Ambulatory Visit: Payer: Self-pay

## 2015-03-11 DIAGNOSIS — Z1231 Encounter for screening mammogram for malignant neoplasm of breast: Secondary | ICD-10-CM

## 2015-03-27 ENCOUNTER — Ambulatory Visit
Admission: RE | Admit: 2015-03-27 | Discharge: 2015-03-27 | Disposition: A | Discharge status: Home / Self Care | Payer: BLUE CROSS/BLUE SHIELD | Source: Ambulatory Visit

## 2015-03-27 DIAGNOSIS — Z1231 Encounter for screening mammogram for malignant neoplasm of breast: Secondary | ICD-10-CM

## 2015-04-01 ENCOUNTER — Ambulatory Visit (INDEPENDENT_AMBULATORY_CARE_PROVIDER_SITE_OTHER): Payer: BLUE CROSS/BLUE SHIELD | Admitting: Family Medicine

## 2015-04-01 ENCOUNTER — Encounter: Payer: Self-pay | Admitting: Family Medicine

## 2015-04-01 VITALS — BP 104/80 | Temp 98.2°F | Ht 60.5 in | Wt 161.2 lb

## 2015-04-01 DIAGNOSIS — M5442 Lumbago with sciatica, left side: Secondary | ICD-10-CM

## 2015-04-01 MED ORDER — NABUMETONE 750 MG PO TABS: 750.0000 mg | ORAL_TABLET | Freq: Two times a day (BID) | ORAL | Status: DC

## 2015-04-01 MED ORDER — HYDROCODONE-ACETAMINOPHEN 5-325 MG PO TABS
1.0000 | ORAL_TABLET | Freq: Every evening | ORAL | Status: DC | PRN
Start: 2015-04-01 — End: 2015-10-17

## 2015-04-01 NOTE — Progress Notes (Signed)
   Subjective:    Patient ID: Krista Davis, female    DOB: [birth year], 49 y.o.   MRN: [id number]  Back Pain This is a new problem. The current episode started in the past 7 days. The problem occurs constantly. The problem has been gradually worsening since onset. The pain is present in the lumbar spine. The quality of the pain is described as aching. The pain radiates to the left thigh and left knee. The pain is moderate. The pain is the same all the time. Stiffness is present all day. She has tried NSAIDs for the symptoms. The treatment provided mild relief.   Patient has no other concerns at this time.   Recent wks incr lifting  starte dfeeling a twinge last tue or wed  Thru the weekend got prety  Bad, hurting ea morn with intense pain  Pain goes doewn into the lower leg, vey sharp and throbbing  Eases off during day, wakes up with it  Pain mainly down left leg,  Taking four ibuprofen three times pe day, then does not need as much in the aft  Some lifting at work and working at home    Review of Systems  Musculoskeletal: Positive for back pain.   no headache no chest pain no change in urinary or bowel habits     Objective:   Physical Exam   Alert vitals stable. Lungs clear. Heart regular in rhythm no spinal tenderness no lumbar tenderness negative straight leg raise     Assessment & Plan:  Impression probable lumbar strain with secondary nerve root irritation discussed plan Relafen twice a day 21 days hydrocodone daily at bedtime stretching exercises encouraged. Hold off on MRI for now rationale discussed WSL multiple questions answered 25 minutes spent most in discussion

## 2015-05-02 ENCOUNTER — Other Ambulatory Visit: Payer: Self-pay | Admitting: Family Medicine

## 2015-10-17 ENCOUNTER — Ambulatory Visit (INDEPENDENT_AMBULATORY_CARE_PROVIDER_SITE_OTHER): Payer: BLUE CROSS/BLUE SHIELD | Admitting: Family Medicine

## 2015-10-17 ENCOUNTER — Encounter: Payer: Self-pay | Admitting: Family Medicine

## 2015-10-17 VITALS — BP 128/86 | Temp 98.6°F | Ht 60.5 in | Wt 160.0 lb

## 2015-10-17 DIAGNOSIS — H6503 Acute serous otitis media, bilateral: Secondary | ICD-10-CM | POA: Diagnosis not present

## 2015-10-17 MED ORDER — CLARITHROMYCIN 500 MG PO TABS: 500.0000 mg | ORAL_TABLET | Freq: Two times a day (BID) | ORAL | Status: DC

## 2015-10-17 MED ORDER — VARENICLINE TARTRATE 1 MG PO TABS: 1.0000 mg | ORAL_TABLET | Freq: Two times a day (BID) | ORAL | Status: DC

## 2015-10-17 MED ORDER — VARENICLINE TARTRATE 0.5 MG X 11 & 1 MG X 42 PO MISC: ORAL | Status: DC

## 2015-10-17 NOTE — Progress Notes (Signed)
   Subjective:    Patient ID: Krista Davis, female    DOB: [birth year], 50 y.o.   MRN: [id number]  Sinusitis This is a new problem. Episode onset: 3 days  Associated symptoms include ear pain. Treatments tried: sinus meds.   STARTED WITH CONGESTION SEVERAL DAYS AGO  GETTING WORSE AND WORSE SINUS MED   HELPED SOME AND THEN IT REFLARED    Hx of sinus headache  Ears first with pressure and slight    Review of Systems  HENT: Positive for ear pain.    No abdominal pain no chest pain no cough    Objective:   Physical Exam  ,rp Alert, mild malaise. Hydration good Vitals stable. frontal/ maxillary tenderness evident positive nasal congestion. TMs retracted with slight effusion bilateral pharynx normal neck supple  lungs clear/no crackles or wheezes. heart regular in rhythm       Assessment & Plan:  Impression rhinosinusitis likely post allergenic with element of bilateral serous otitis media, discussed with patient. plan antibiotics prescribed. Questions answered. Symptomatic care discussed. warning signs discussed. WSL

## 2016-02-29 IMAGING — MG MM DIGITAL SCREENING BILAT
4 series · 4 of 4 positions shown · non-contrast
Comparison: Previous exam(s).

CLINICAL DATA: Screening.

EXAM:
DIGITAL SCREENING BILATERAL MAMMOGRAM WITH CAD

[R CC]
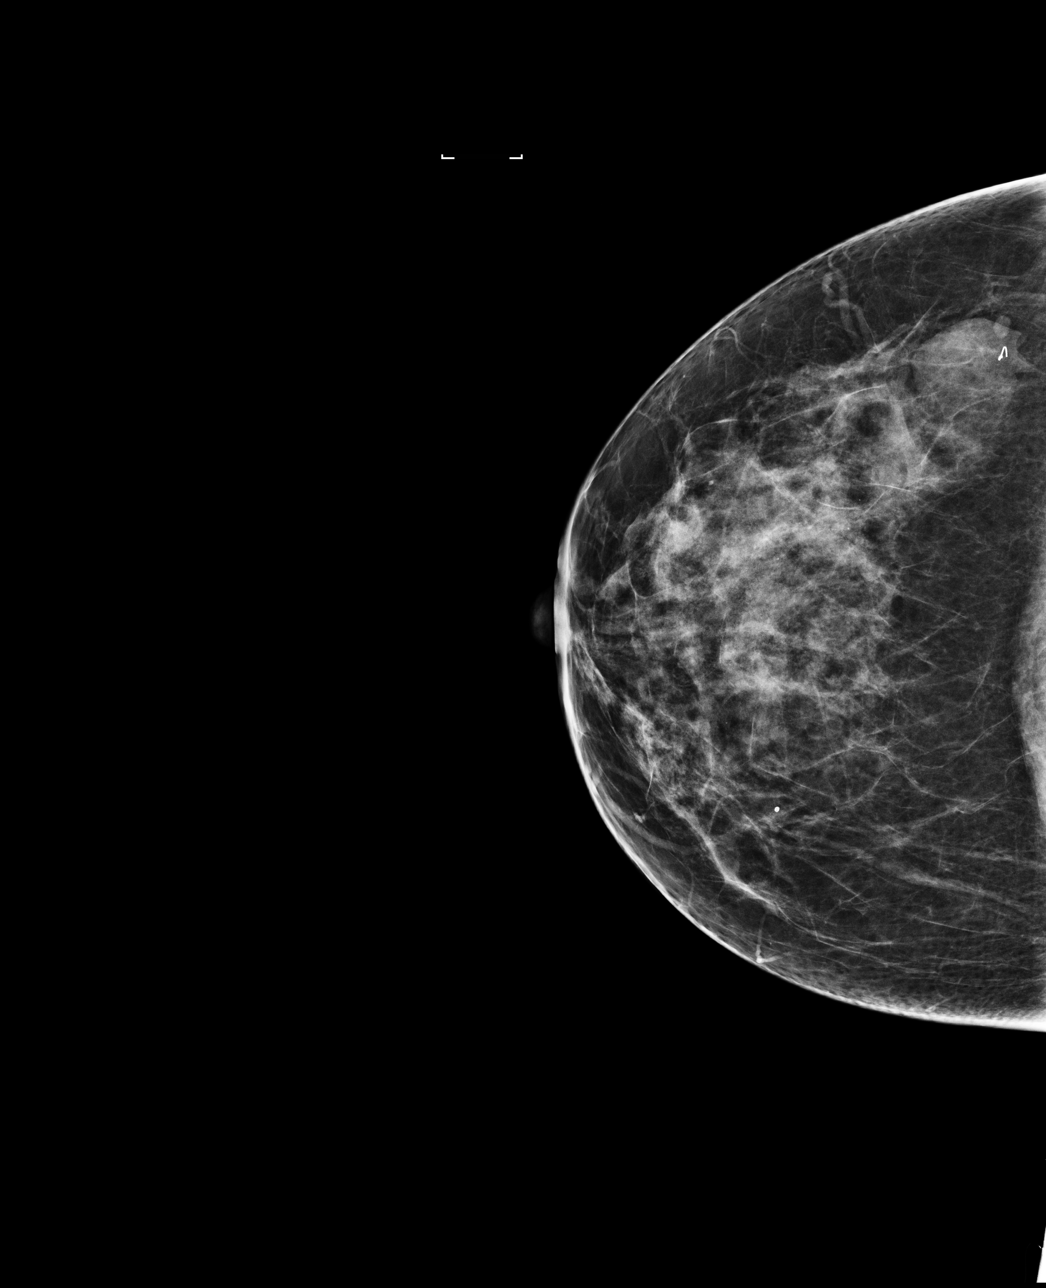

[L CC]
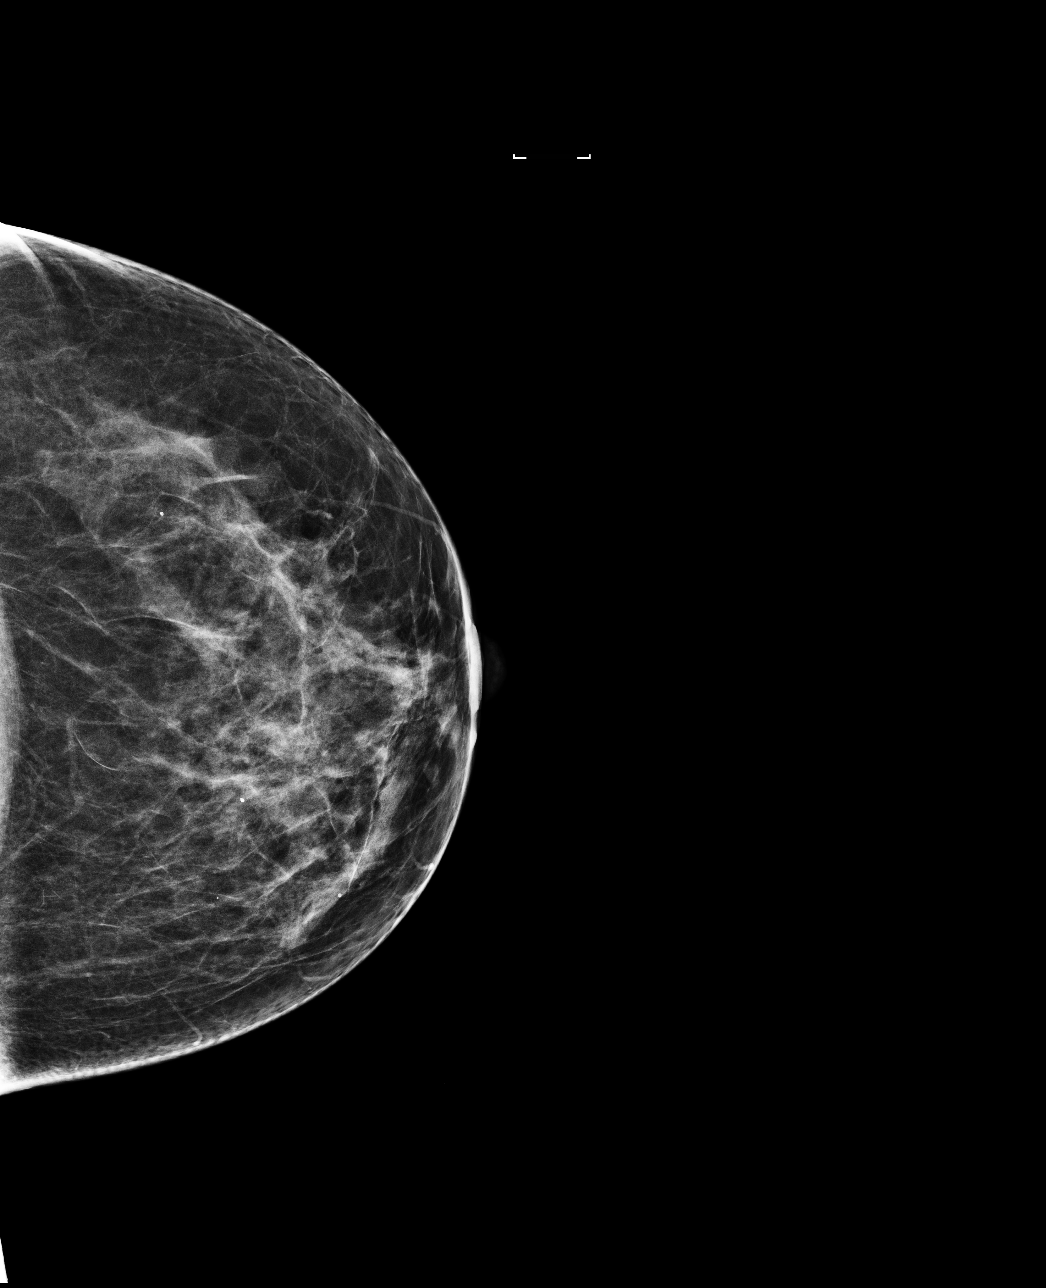

[R MLO]
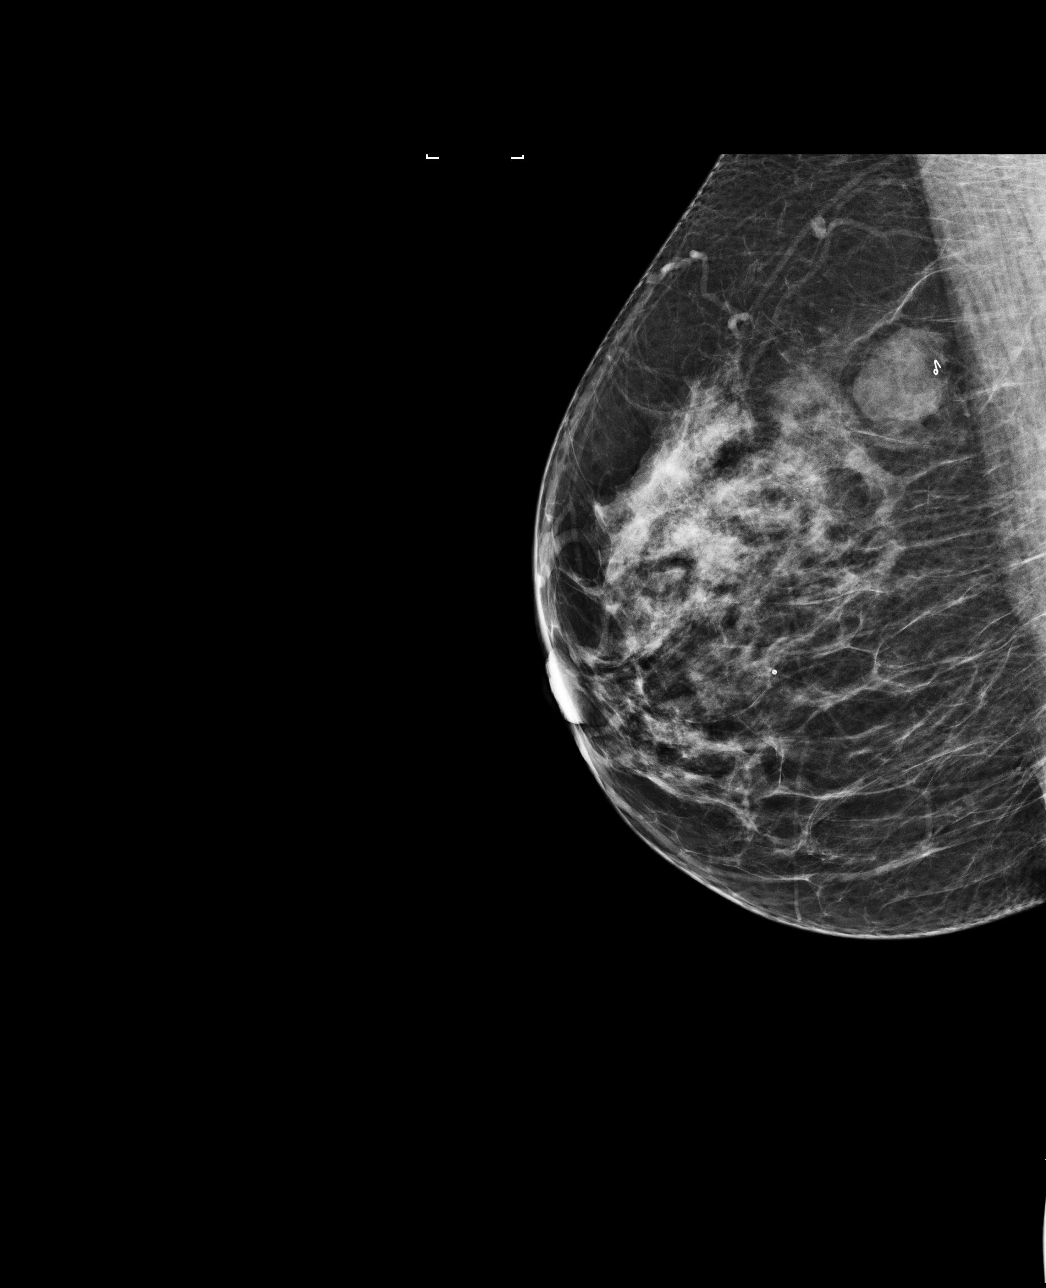

[L MLO]
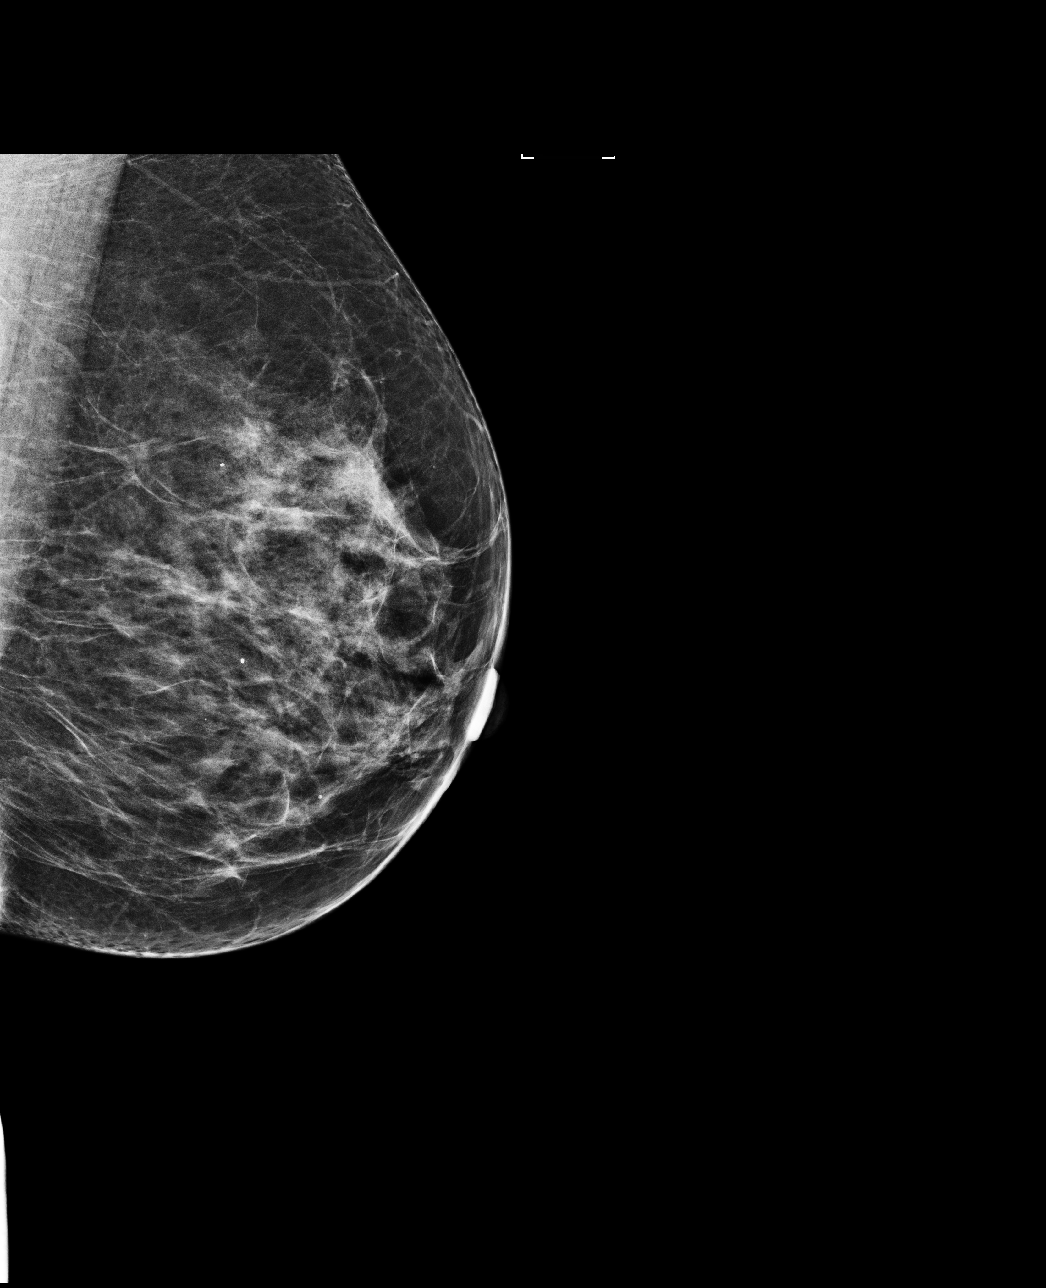

[4 of 4 positions shown; findings below may reference images not displayed]

ACR Breast Density Category c: The breast tissue is heterogeneously
dense, which may obscure small masses.
FINDINGS: There are no findings suspicious for malignancy. Images were
processed with CAD.
IMPRESSION: No mammographic evidence of malignancy. A result letter of this
screening mammogram will be mailed directly to the patient.

RECOMMENDATION:
Screening mammogram in one year. (Code:YJ-2-FEZ)

BI-RADS CATEGORY  1: Negative.

## 2016-04-02 ENCOUNTER — Ambulatory Visit (INDEPENDENT_AMBULATORY_CARE_PROVIDER_SITE_OTHER): Payer: BLUE CROSS/BLUE SHIELD | Admitting: Family Medicine

## 2016-04-02 ENCOUNTER — Encounter: Payer: Self-pay | Admitting: Family Medicine

## 2016-04-02 VITALS — BP 132/84 | Temp 98.5°F | Ht 60.5 in | Wt 166.8 lb

## 2016-04-02 DIAGNOSIS — L237 Allergic contact dermatitis due to plants, except food: Secondary | ICD-10-CM

## 2016-04-02 DIAGNOSIS — L03116 Cellulitis of left lower limb: Secondary | ICD-10-CM | POA: Diagnosis not present

## 2016-04-02 MED ORDER — CEPHALEXIN 500 MG PO CAPS: 500.0000 mg | ORAL_CAPSULE | Freq: Four times a day (QID) | ORAL | 0 refills | Status: DC

## 2016-04-02 MED ORDER — DOXYCYCLINE HYCLATE 100 MG PO TABS: 100.0000 mg | ORAL_TABLET | Freq: Two times a day (BID) | ORAL | 0 refills | Status: DC

## 2016-04-02 MED ORDER — PREDNISONE 20 MG PO TABS: ORAL_TABLET | ORAL | 0 refills | Status: DC

## 2016-04-02 NOTE — Progress Notes (Signed)
   Subjective:    Patient ID: Krista Davis, female    DOB: 09-29-1965, 50 y.o.   MRN: [id number]  HPI Patient arrives with c/o poison oak on legs-believes a place on her leg has become infected.  patient was working outside got exposed to [place] has a lot of itching rashes as been present ever since this is then progressively worse. No other particular troubles. PMH benign.  Review of Systems   Tenderness around the rash on the left thigh region also some redness around a couple other areas rash itches intensely denies fever vomiting chills    Objective:   Physical Exam  multiple areas of restart dermatitis but one in particular on the left eye has associated cellulitis redness and tenderness consistent with cellulitis and infection       Assessment & Plan:   contact dermatitis Localized cellulitis  antibiotics to cover for staph as well as MRSA   prednisone for the contact dermatitis Follow-up if progressive troubles -warning signs were discussed in detail

## 2016-07-10 DIAGNOSIS — H18832 Recurrent erosion of cornea, left eye: Secondary | ICD-10-CM | POA: Diagnosis not present

## 2016-07-20 DIAGNOSIS — H18832 Recurrent erosion of cornea, left eye: Secondary | ICD-10-CM | POA: Diagnosis not present

## 2016-08-13 DIAGNOSIS — H18832 Recurrent erosion of cornea, left eye: Secondary | ICD-10-CM | POA: Diagnosis not present

## 2016-08-19 DIAGNOSIS — H16002 Unspecified corneal ulcer, left eye: Secondary | ICD-10-CM | POA: Diagnosis not present

## 2016-08-19 DIAGNOSIS — H04122 Dry eye syndrome of left lacrimal gland: Secondary | ICD-10-CM | POA: Diagnosis not present

## 2016-09-02 DIAGNOSIS — H16002 Unspecified corneal ulcer, left eye: Secondary | ICD-10-CM | POA: Diagnosis not present

## 2016-09-02 DIAGNOSIS — H04122 Dry eye syndrome of left lacrimal gland: Secondary | ICD-10-CM | POA: Diagnosis not present

## 2016-10-02 ENCOUNTER — Other Ambulatory Visit: Payer: Self-pay | Admitting: Family Medicine

## 2016-10-06 ENCOUNTER — Telehealth: Payer: Self-pay | Admitting: Family Medicine

## 2016-10-06 NOTE — Telephone Encounter (Addendum)
Patient advised insurance denied Chantix, we've not seen in half a yr, can use otc nicotine gum, if wants to discuss other prescription meds like wellbutrin etc will need office visit. Patient verbalized understanding and stated she will try OTC measures first and schedule office visit if fails on OTC measures.

## 2016-10-06 NOTE — Telephone Encounter (Signed)
Tell pt denied, weve not seen in half a yr, can use otc nicotine gum, if wants to disc other prescription meds like wellbutrin etc will need o v

## 2016-10-06 NOTE — Telephone Encounter (Signed)
The Prior authorization for Chantix starter pack and continuing month pack was denied by [facility] due to the following: The patient does not have a history of failure, contraindication or intolerance to one of the following: Nicoderm CQ OTC, Nicorette Gum OTC, Nicorette Lozenge OTC, Nicorette mini- lozenge OTC, Thrive Gum OTC, Thrive Lozenge OTC, and/or the patient does not have a history of failure, contraindication or intolerance to bupropion.

## 2016-11-03 DIAGNOSIS — D3709 Neoplasm of uncertain behavior of other specified sites of the oral cavity: Secondary | ICD-10-CM | POA: Diagnosis not present

## 2017-06-04 DIAGNOSIS — Z01419 Encounter for gynecological examination (general) (routine) without abnormal findings: Secondary | ICD-10-CM | POA: Diagnosis not present

## 2017-06-04 DIAGNOSIS — Z1231 Encounter for screening mammogram for malignant neoplasm of breast: Secondary | ICD-10-CM | POA: Diagnosis not present

## 2017-08-02 ENCOUNTER — Ambulatory Visit: Payer: 59 | Admitting: Family Medicine

## 2017-08-02 VITALS — BP 142/90 | Ht 60.5 in | Wt 177.0 lb

## 2017-08-02 DIAGNOSIS — Z13220 Encounter for screening for lipoid disorders: Secondary | ICD-10-CM

## 2017-08-02 DIAGNOSIS — R5383 Other fatigue: Secondary | ICD-10-CM | POA: Diagnosis not present

## 2017-08-02 DIAGNOSIS — I10 Essential (primary) hypertension: Secondary | ICD-10-CM

## 2017-08-02 MED ORDER — VERAPAMIL HCL ER 180 MG PO TBCR: 180.0000 mg | EXTENDED_RELEASE_TABLET | Freq: Every day | ORAL | 5 refills | Status: DC

## 2017-08-02 MED ORDER — VARENICLINE TARTRATE 1 MG PO TABS: ORAL_TABLET | ORAL | 1 refills | Status: DC

## 2017-08-02 MED ORDER — VARENICLINE TARTRATE 0.5 MG X 11 & 1 MG X 42 PO MISC: ORAL | 0 refills | Status: DC

## 2017-08-02 NOTE — Progress Notes (Signed)
   Subjective:    Patient ID: Krista Davis, female    DOB: 01/19/1966, 52 y.o.   MRN: [id number] Patient arrives with numerous concerns HPI Patient here for a check on her blood pressure- she has been having headaches for a few months and her blood pressure was elevated at her gyn physical in December. Patient would also like to discuss Chantix.  Pos fam hx of cad  Pt notes b p has ben up for the pat few mo, numbers were up wen she went to her gyn   Pt having daily headaches practically every day, feeln gpressure inside her head   Not exrcising  Sig stress recen t yrs, worse last few months, has gained 20 opounds last twenty yrs  No major change in diet but now gainging Weight  Doing a new job, stressful, home life is p stresful, husb facing depr and other challenges and not takong meds, christmas "of course", daughter just started driing,  Pressure bitemporal     Mostly top and front, symmetric, non throbing, soer radiate ot he neck  Use motrin two to four tabs as neede  Pt motivated to try to quit, took chantix on the past, then thought ist was causing stress and stopped taking the chantix    Has gained seigh Review of Systems  All other systems reviewed and are negative.      Objective:   Physical Exam  Alert and oriented, vitals reviewed and stable, NAD ENT-TM's and ext canals WNL bilat via otoscopic exam Soft palate, tonsils and post pharynx WNL via oropharyngeal exam Neck-symmetric, no masses; thyroid nonpalpable and nontender Pulmonary-no tachypnea or accessory muscle use; Clear without wheezes via auscultation Card--no abnrml murmurs, rhythm reg and rate WNL Carotid pulses symmetric, without bruits       Assessment & Plan:  Impression hypertension new-onset discussed positive family history of substantial symptoms and patient motivated to get it down we will press on with therapy.  Verapamil SR 180 daily.  At nighttime side effects benefits  discussed  2.  Chronic smoking with desire to quit.  Had success with Chantix in the past.  Not sure if her insurance will cover resources told her it would.  We will try to schedule  3.  Fatigue weight gain.  No recent blood work.  Patient not exercising substantial discussion regarding this.  Will evaluate blood work  Greater than 50% of this 25 minute face to face visit was spent in counseling and discussion and coordination of care regarding the above diagnosis/diagnosies

## 2017-08-02 NOTE — Patient Instructions (Signed)

## 2017-08-06 ENCOUNTER — Telehealth: Payer: Self-pay | Admitting: Family Medicine

## 2017-08-06 NOTE — Telephone Encounter (Signed)
Patient seen Dr. Richardson Landry on 08/02/17.  He prescribed her Chantix.  Her pharmacy told her that they need prior authorization for this medication and would like to check on status of this.

## 2017-08-06 NOTE — Telephone Encounter (Signed)
Please advise 

## 2017-08-09 NOTE — Telephone Encounter (Signed)
Submitted prior auth via phone 08/06/17, waiting for decision, per rep could take 72 hours  Called & notified pt, pt verbalized understanding

## 2017-08-10 DIAGNOSIS — Z13220 Encounter for screening for lipoid disorders: Secondary | ICD-10-CM | POA: Diagnosis not present

## 2017-08-10 DIAGNOSIS — I10 Essential (primary) hypertension: Secondary | ICD-10-CM | POA: Diagnosis not present

## 2017-08-10 DIAGNOSIS — R5383 Other fatigue: Secondary | ICD-10-CM | POA: Diagnosis not present

## 2017-08-11 LAB — HEPATIC FUNCTION PANEL
ALT: 17 IU/L (ref 0–32)
AST: 15 IU/L (ref 0–40)
Albumin: 4.7 g/dL (ref 3.5–5.5)
Alkaline Phosphatase: 86 IU/L (ref 39–117)
BILIRUBIN TOTAL: 0.3 mg/dL (ref 0.0–1.2)
BILIRUBIN, DIRECT: 0.09 mg/dL (ref 0.00–0.40)
TOTAL PROTEIN: 7 g/dL (ref 6.0–8.5)

## 2017-08-11 LAB — BASIC METABOLIC PANEL
BUN/Creatinine Ratio: 14 (ref 9–23)
BUN: 10 mg/dL (ref 6–24)
CO2: 21 mmol/L (ref 20–29)
Calcium: 9.6 mg/dL (ref 8.7–10.2)
Chloride: 106 mmol/L (ref 96–106)
Creatinine, Ser: 0.71 mg/dL (ref 0.57–1.00)
GFR calc Af Amer: 114 mL/min/{1.73_m2} (ref >59)
GFR, EST NON AFRICAN AMERICAN: 99 mL/min/{1.73_m2} (ref >59)
Glucose: 107 mg/dL — ABNORMAL HIGH (ref 65–99)
POTASSIUM: 4.5 mmol/L (ref 3.5–5.2)
SODIUM: 142 mmol/L (ref 134–144)

## 2017-08-11 LAB — LIPID PANEL
CHOL/HDL RATIO: 3.8 ratio (ref 0.0–4.4)
Cholesterol, Total: 215 mg/dL — ABNORMAL HIGH (ref 100–199)
HDL: 56 mg/dL (ref >39)
LDL Calculated: 127 mg/dL — ABNORMAL HIGH (ref 0–99)
Triglycerides: 160 mg/dL — ABNORMAL HIGH (ref 0–149)
VLDL Cholesterol Cal: 32 mg/dL (ref 5–40)

## 2017-08-11 LAB — TSH: TSH: 2.34 u[IU]/mL (ref 0.450–4.500)

## 2017-08-12 ENCOUNTER — Encounter: Payer: Self-pay | Admitting: Family Medicine

## 2017-08-13 ENCOUNTER — Telehealth: Payer: Self-pay | Admitting: *Deleted

## 2017-08-13 NOTE — Telephone Encounter (Signed)
Fax from [facility]. Unable to approve request for coverage of chantix starting pak. Please see letter in dr steve's folder.

## 2017-09-16 ENCOUNTER — Telehealth: Payer: Self-pay | Admitting: Family Medicine

## 2017-09-16 NOTE — Telephone Encounter (Signed)
Fax from pharmacy stating that verapamil er 180mg  tab on mfg back order. Pharmacy would like a change in med.

## 2017-09-17 NOTE — Telephone Encounter (Signed)
Switch to norvasc 5 mg enough ref thru next visit

## 2017-09-17 NOTE — Telephone Encounter (Signed)
Pt does not have an upcoming appt; will call Monday to get her to schedule and appt so we can know how many refills

## 2017-09-20 ENCOUNTER — Other Ambulatory Visit: Payer: Self-pay | Admitting: *Deleted

## 2017-09-20 MED ORDER — AMLODIPINE BESYLATE 5 MG PO TABS: 5.0000 mg | ORAL_TABLET | Freq: Every day | ORAL | 0 refills | Status: DC

## 2017-09-20 NOTE — Telephone Encounter (Signed)
Discussed with pt. Pt states she was suppose to come back in May. She requested a 90 day supply of norvasc. Med sent to pharm.

## 2017-09-20 NOTE — Telephone Encounter (Signed)
Left message to return call 

## 2018-04-07 DIAGNOSIS — Z23 Encounter for immunization: Secondary | ICD-10-CM | POA: Diagnosis not present

## 2018-07-13 DIAGNOSIS — Z01419 Encounter for gynecological examination (general) (routine) without abnormal findings: Secondary | ICD-10-CM | POA: Diagnosis not present

## 2018-07-13 DIAGNOSIS — Z1389 Encounter for screening for other disorder: Secondary | ICD-10-CM | POA: Diagnosis not present

## 2018-07-13 DIAGNOSIS — N760 Acute vaginitis: Secondary | ICD-10-CM | POA: Diagnosis not present

## 2018-07-13 DIAGNOSIS — Z130 Encounter for screening for diseases of the blood and blood-forming organs and certain disorders involving the immune mechanism: Secondary | ICD-10-CM | POA: Diagnosis not present

## 2018-07-13 DIAGNOSIS — Z1231 Encounter for screening mammogram for malignant neoplasm of breast: Secondary | ICD-10-CM | POA: Diagnosis not present

## 2019-06-05 ENCOUNTER — Other Ambulatory Visit: Payer: Self-pay

## 2019-06-05 ENCOUNTER — Ambulatory Visit (INDEPENDENT_AMBULATORY_CARE_PROVIDER_SITE_OTHER): Payer: BC Managed Care – PPO | Admitting: Family Medicine

## 2019-06-05 ENCOUNTER — Encounter: Payer: Self-pay | Admitting: Family Medicine

## 2019-06-05 DIAGNOSIS — I10 Essential (primary) hypertension: Secondary | ICD-10-CM

## 2019-06-05 MED ORDER — VERAPAMIL HCL ER 180 MG PO TBCR: EXTENDED_RELEASE_TABLET | ORAL | 1 refills | Status: DC

## 2019-06-05 NOTE — Progress Notes (Signed)
   Subjective:    Patient ID: Krista Davis, female    DOB: 01/26/1966, 53 y.o.   MRN: [id number]  Hypertension This is a chronic problem. Associated symptoms include headaches. (Higher than normal BP readings. Yesterday [id number]; Friday 143/93; Tuesday 160/108, vision issues) Treatments tried: diet changes, previous medications.   Virtual Visit via Video Note  I connected with Krista Davis on 06/05/19 at  2:30 PM EST by a video enabled telemedicine application and verified that I am speaking with the correct person using two identifiers.  Location: Patient: home Provider: office    I discussed the limitations of evaluation and management by telemedicine and the availability of in person appointments. The patient expressed understanding and agreed to proceed.  History of Present Illness:    Observations/Objective:   Assessment and Plan:   Follow Up Instructions:    I discussed the assessment and treatment plan with the patient. The patient was provided an opportunity to ask questions and all were answered. The patient agreed with the plan and demonstrated an understanding of the instructions.   The patient was advised to call back or seek an in-person evaluation if the symptoms worsen or if the condition fails to improve as anticipated.  I provided 18 minutes of non-face-to-face time during this encounter.   Vicente Males, LPN  Notes a lot of stress   h a intermittently  Review of Systems  Neurological: Positive for headaches.       Objective:   Physical Exam   Virtual     Assessment & Plan:  Impression hypertension.  Recent noncompliance.  Has started having symptoms again.  Checked her blood pressures considerably elevated.  Stated the first medicine she was on the verapamil she tolerated well seem to help her symptoms.  Recently going through a lot of stress with a separation and pending divorce.  Also some stress at work.  Will refill verapamil  180 SR nightly.  Diet exercise discussed and encouraged

## 2019-07-18 ENCOUNTER — Ambulatory Visit: Payer: BLUE CROSS/BLUE SHIELD | Attending: Internal Medicine

## 2019-07-18 DIAGNOSIS — Z20822 Contact with and (suspected) exposure to covid-19: Secondary | ICD-10-CM | POA: Diagnosis not present

## 2019-07-20 LAB — NOVEL CORONAVIRUS, NAA: SARS-CoV-2, NAA: NOT DETECTED

## 2019-07-26 DIAGNOSIS — Z1231 Encounter for screening mammogram for malignant neoplasm of breast: Secondary | ICD-10-CM | POA: Diagnosis not present

## 2019-07-26 DIAGNOSIS — Z130 Encounter for screening for diseases of the blood and blood-forming organs and certain disorders involving the immune mechanism: Secondary | ICD-10-CM | POA: Diagnosis not present

## 2019-07-26 DIAGNOSIS — Z124 Encounter for screening for malignant neoplasm of cervix: Secondary | ICD-10-CM | POA: Diagnosis not present

## 2019-07-26 DIAGNOSIS — Z01419 Encounter for gynecological examination (general) (routine) without abnormal findings: Secondary | ICD-10-CM | POA: Diagnosis not present

## 2019-07-26 DIAGNOSIS — Z1151 Encounter for screening for human papillomavirus (HPV): Secondary | ICD-10-CM | POA: Diagnosis not present

## 2019-08-02 ENCOUNTER — Encounter: Payer: Self-pay | Admitting: Family Medicine

## 2019-11-01 DIAGNOSIS — M79671 Pain in right foot: Secondary | ICD-10-CM | POA: Diagnosis not present

## 2019-11-01 DIAGNOSIS — S93601A Unspecified sprain of right foot, initial encounter: Secondary | ICD-10-CM | POA: Diagnosis not present

## 2019-12-24 ENCOUNTER — Other Ambulatory Visit: Payer: Self-pay | Admitting: Family Medicine

## 2019-12-25 NOTE — Telephone Encounter (Signed)
Pt made appt for 01/15/2020 at 3:00

## 2019-12-25 NOTE — Telephone Encounter (Signed)
Last labs 08/10/2017. Cbc, tsh, bmp, liver, lipid, and has appt scheduled for 7/19

## 2019-12-25 NOTE — Telephone Encounter (Signed)
Yes, pls order labs from last time. Thx. Dr. Lovena Le

## 2019-12-26 ENCOUNTER — Other Ambulatory Visit: Payer: Self-pay | Admitting: *Deleted

## 2019-12-26 DIAGNOSIS — Z13220 Encounter for screening for lipoid disorders: Secondary | ICD-10-CM

## 2019-12-26 DIAGNOSIS — R5383 Other fatigue: Secondary | ICD-10-CM

## 2019-12-26 NOTE — Telephone Encounter (Signed)
Orders put in and left a message to return call to notify pt.

## 2019-12-26 NOTE — Telephone Encounter (Signed)
Pt.notified

## 2020-01-11 DIAGNOSIS — R5383 Other fatigue: Secondary | ICD-10-CM | POA: Diagnosis not present

## 2020-01-11 DIAGNOSIS — Z13220 Encounter for screening for lipoid disorders: Secondary | ICD-10-CM | POA: Diagnosis not present

## 2020-01-12 LAB — LIPID PANEL
Chol/HDL Ratio: 4.4 ratio (ref 0.0–4.4)
Cholesterol, Total: 260 mg/dL — ABNORMAL HIGH (ref 100–199)
HDL: 59 mg/dL (ref >39)
LDL Chol Calc (NIH): 157 mg/dL — ABNORMAL HIGH (ref 0–99)
Triglycerides: 242 mg/dL — ABNORMAL HIGH (ref 0–149)
VLDL Cholesterol Cal: 44 mg/dL — ABNORMAL HIGH (ref 5–40)

## 2020-01-12 LAB — CBC WITH DIFFERENTIAL/PLATELET
Basophils Absolute: 0 10*3/uL (ref 0.0–0.2)
Basos: 1 %
EOS (ABSOLUTE): 0.1 10*3/uL (ref 0.0–0.4)
Eos: 2 %
Hematocrit: 45.4 % (ref 34.0–46.6)
Hemoglobin: 15.6 g/dL (ref 11.1–15.9)
Immature Grans (Abs): 0 10*3/uL (ref 0.0–0.1)
Immature Granulocytes: 0 %
Lymphocytes Absolute: 1.6 10*3/uL (ref 0.7–3.1)
Lymphs: 29 %
MCH: 31.5 pg (ref 26.6–33.0)
MCHC: 34.4 g/dL (ref 31.5–35.7)
MCV: 92 fL (ref 79–97)
Monocytes Absolute: 0.4 10*3/uL (ref 0.1–0.9)
Monocytes: 8 %
Neutrophils Absolute: 3.5 10*3/uL (ref 1.4–7.0)
Neutrophils: 60 %
Platelets: 274 10*3/uL (ref 150–450)
RBC: 4.95 x10E6/uL (ref 3.77–5.28)
RDW: 11.9 % (ref 11.7–15.4)
WBC: 5.6 10*3/uL (ref 3.4–10.8)

## 2020-01-12 LAB — BASIC METABOLIC PANEL
BUN/Creatinine Ratio: 16 (ref 9–23)
BUN: 13 mg/dL (ref 6–24)
CO2: 21 mmol/L (ref 20–29)
Calcium: 10.3 mg/dL — ABNORMAL HIGH (ref 8.7–10.2)
Chloride: 105 mmol/L (ref 96–106)
Creatinine, Ser: 0.82 mg/dL (ref 0.57–1.00)
GFR calc Af Amer: 94 mL/min/{1.73_m2} (ref >59)
GFR calc non Af Amer: 81 mL/min/{1.73_m2} (ref >59)
Glucose: 107 mg/dL — ABNORMAL HIGH (ref 65–99)
Potassium: 5.6 mmol/L — ABNORMAL HIGH (ref 3.5–5.2)
Sodium: 140 mmol/L (ref 134–144)

## 2020-01-12 LAB — HEPATIC FUNCTION PANEL
ALT: 16 IU/L (ref 0–32)
AST: 17 IU/L (ref 0–40)
Albumin: 4.9 g/dL (ref 3.8–4.9)
Alkaline Phosphatase: 99 IU/L (ref 48–121)
Bilirubin Total: 0.4 mg/dL (ref 0.0–1.2)
Bilirubin, Direct: 0.09 mg/dL (ref 0.00–0.40)
Total Protein: 7.3 g/dL (ref 6.0–8.5)

## 2020-01-12 LAB — TSH: TSH: 2.2 u[IU]/mL (ref 0.450–4.500)

## 2020-01-15 ENCOUNTER — Other Ambulatory Visit: Payer: Self-pay

## 2020-01-15 ENCOUNTER — Encounter: Payer: Self-pay | Admitting: Family Medicine

## 2020-01-15 ENCOUNTER — Ambulatory Visit: Payer: BC Managed Care – PPO | Admitting: Family Medicine

## 2020-01-15 VITALS — BP 126/84 | HR 82 | Temp 97.2°F | Ht 60.5 in | Wt 176.6 lb

## 2020-01-15 DIAGNOSIS — E1169 Type 2 diabetes mellitus with other specified complication: Secondary | ICD-10-CM | POA: Diagnosis not present

## 2020-01-15 DIAGNOSIS — I10 Essential (primary) hypertension: Secondary | ICD-10-CM | POA: Diagnosis not present

## 2020-01-15 DIAGNOSIS — Z1211 Encounter for screening for malignant neoplasm of colon: Secondary | ICD-10-CM

## 2020-01-15 DIAGNOSIS — E785 Hyperlipidemia, unspecified: Secondary | ICD-10-CM | POA: Diagnosis not present

## 2020-01-15 MED ORDER — VARENICLINE TARTRATE 1 MG PO TABS: ORAL_TABLET | ORAL | 1 refills | Status: DC

## 2020-01-15 MED ORDER — CHANTIX STARTING MONTH PAK 0.5 MG X 11 & 1 MG X 42 PO TABS: ORAL_TABLET | ORAL | 0 refills | Status: DC

## 2020-01-15 NOTE — Patient Instructions (Signed)
Dr. Roxana Hires and Dr. Gala Romney. - Gastroenterology in [place] area.

## 2020-01-15 NOTE — Progress Notes (Signed)
Patient ID: Krista Davis, female    DOB: 30-Jun-1965, 54 y.o.   MRN: [id number]   Chief Complaint  Patient presents with  . Hypertension    follow up   Subjective:    HPI  F/u HTN and smoking cessation-  Pt in past was trying chantix and insurance wasn't covered. Pt did well with chantix. Did have vivid dreams, but was tolerable.  Seeing gyn- not on hrt.  Taking 73m prozac for anxiety/stress. Having mammo yearly,  hasn't had colonoscopy. Seeing gyn yearly- for pap- last visit- 1/ 2021. [facility]- Dr. MOrpah Greek  Lost 26 lbs, last summer. But separated from husband last august in 2020. Moved in with mother and with daughter moved back home. High stress in the past year. Stopped trying to diet. Was doing weight watchers last year.  Pt wanting to try to get back on track with weight and diet.  HTN Pt compliant with BP meds.  No SEs Denies chest pain, sob, LE swelling, or blurry vision. Taking bp meds at night.  Would like to change to taking during the day.   Medical History MCharminhas a past medical history of No abnormality seen and Seasonal allergies.   Outpatient Encounter Medications as of 01/15/2020  Medication Sig  . [DISCONTINUED] FLUoxetine (PROZAC) 10 MG capsule fluoxetine 10 mg capsule  TAKE ONE CAPSULE BY MOUTH ONCE DAILY  . acetaminophen (TYLENOL) 500 MG tablet Take 500 mg by mouth every 6 (six) hours as needed.  .Marland KitchenamLODipine (NORVASC) 5 MG tablet Take 1 tablet (5 mg total) by mouth daily. (Patient not taking: Reported on 06/05/2019)  . FLUoxetine (PROZAC) 20 MG capsule fluoxetine 20 mg capsule  TAKE 1 CAPSULE BY MOUTH EVERY DAY  . varenicline (CHANTIX CONTINUING MONTH PAK) 1 MG tablet take as directed ON PACKAGE  . varenicline (CHANTIX STARTING MONTH PAK) 0.5 MG X 11 & 1 MG X 42 tablet take as directed  . verapamil (CALAN-SR) 180 MG CR tablet TAKE 1 TABLET BY MOUTH EVERY NIGHT AT BEDTIME  . [DISCONTINUED] estradiol-norethindrone (ACTIVELLA)  1-0.5 MG per tablet Take 1 tablet by mouth daily.  . [DISCONTINUED] varenicline (CHANTIX CONTINUING MONTH PAK) 1 MG tablet take as directed ON PACKAGE (Patient not taking: Reported on 06/05/2019)  . [DISCONTINUED] varenicline (CHANTIX STARTING MONTH PAK) 0.5 MG X 11 & 1 MG X 42 tablet take as directed (Patient not taking: Reported on 06/05/2019)   No facility-administered encounter medications on file as of 01/15/2020.     Review of Systems  Constitutional: Negative for chills and fever.  HENT: Negative for congestion, rhinorrhea and sore throat.   Respiratory: Negative for cough, shortness of breath and wheezing.   Cardiovascular: Negative for chest pain and leg swelling.  Gastrointestinal: Negative for abdominal pain, diarrhea, nausea and vomiting.  Genitourinary: Negative for dysuria and frequency.  Musculoskeletal: Negative for arthralgias and back pain.  Skin: Negative for rash.  Neurological: Negative for dizziness, weakness and headaches.     Vitals BP 126/84   Pulse 82   Temp (!) 97.2 F (36.2 C) (Oral)   Ht 5' 0.5" (1.537 m)   Wt 176 lb 9.6 oz (80.1 kg)   SpO2 96%   BMI 33.92 kg/m   Objective:   Physical Exam Vitals and nursing note reviewed.  Constitutional:      General: She is not in acute distress.    Appearance: Normal appearance. She is not ill-appearing.  HENT:     Head: Normocephalic and atraumatic.  Eyes:     Extraocular Movements: Extraocular movements intact.     Conjunctiva/sclera: Conjunctivae normal.     Pupils: Pupils are equal, round, and reactive to light.  Cardiovascular:     Rate and Rhythm: Normal rate and regular rhythm.     Pulses: Normal pulses.     Heart sounds: Normal heart sounds.  Pulmonary:     Effort: Pulmonary effort is normal.     Breath sounds: Normal breath sounds. No wheezing, rhonchi or rales.  Musculoskeletal:        General: Normal range of motion.     Right lower leg: No edema.     Left lower leg: No edema.  Skin:     General: Skin is warm and dry.     Findings: No lesion or rash.  Neurological:     General: No focal deficit present.     Mental Status: She is alert and oriented to person, place, and time.     Cranial Nerves: No cranial nerve deficit.     Sensory: No sensory deficit.     Motor: No weakness.  Psychiatric:        Mood and Affect: Mood normal.        Behavior: Behavior normal.      Assessment and Plan   1. Essential hypertension - CBC; Future - CMP14+EGFR; Future  2. Screen for colon cancer - Ambulatory referral to Gastroenterology  3. Hyperlipidemia associated with type 2 diabetes mellitus ([place]) - CMP14+EGFR; Future - Lipid panel; Future   Pt wanting to try chantix again for smoking cessation.  HTN- controlled, cont meds.  HLD- uncontrolled.   Pt is 7.3% risk of ASCVD- borderline risk- may consider statin vs. Lifestyle changes. Pt wanting to try to go back to diet and weight watcher and inc in exercising, before starting a medication.  Anxiety- improved. cont prozac.   HM- ordered screening colonoscopy.  F/u [address] prn.

## 2020-01-21 ENCOUNTER — Encounter: Payer: Self-pay | Admitting: Family Medicine

## 2020-01-25 ENCOUNTER — Encounter: Payer: Self-pay | Admitting: Family Medicine

## 2020-01-30 ENCOUNTER — Encounter: Payer: Self-pay | Admitting: Internal Medicine

## 2020-03-06 ENCOUNTER — Other Ambulatory Visit: Payer: Self-pay | Admitting: Family Medicine

## 2020-03-27 ENCOUNTER — Other Ambulatory Visit: Payer: Self-pay

## 2020-03-27 ENCOUNTER — Ambulatory Visit (INDEPENDENT_AMBULATORY_CARE_PROVIDER_SITE_OTHER): Payer: Self-pay | Admitting: *Deleted

## 2020-03-27 VITALS — Ht 60.6 in | Wt 164.8 lb

## 2020-03-27 DIAGNOSIS — Z1211 Encounter for screening for malignant neoplasm of colon: Secondary | ICD-10-CM

## 2020-03-27 NOTE — Patient Instructions (Signed)
[facility]   Please notify us immediately if you are diabetic, take iron supplements, or if you are on coumadin or any blood thinners.   Patient Name: Krista Davis Date of procedure: 04/15/2020 Time to register at [facility] Stay: 8:30 am Provider: Dr. Abbey Chatters   Purchase: MIRALAX 238 gram bottle, 1 FLEET ENEMA, 1 box of DULCOLAX (All over the counter medications)    04/13/2020- 2 Days prior to procedure: START CLEAR LIQUID DIET AFTER YOUR LUNCH MEAL--NO SOLID FOODS!   04/14/2020- 1 Day prior to procedure:   CLEAR LIQUIDS ALL DAY--NO SOLID FOODS!   Diabetic medication adjustments for today:    At 10:00 AM, take 2 DULCOLAX $RemoveBe'5mg'UQxpsfPLR$  tablets   At 12:00 PM, Mix 5 teaspoons of Miralax in any 4-6 ounces of CLEAR LIQUIDS (Gatorade) every hour for 5 hours until passing clear, watery stools. Be sure to drink 4 ounces of clear liquid 30 minutes after each dose of Miralax.   At 3:00 PM, take 2 Dulcolax $RemoveBe'5mg'RMLyjcXOD$  tablets   If stools are not clear & watery by 6:00 PM, take 5 teaspoons of Miralax every 30 minutes until stools are clear (no color)   You must have a complete prep to ensure the most effective cleaning.   CONTINUE CLEAR LIQUIDS ONLY UNTIL MIDNIGHT. Make a conscious effort to drink as much as you can before, during & after the preparation.    NOTHING TO EAT OR DRINK AFTER MIDNIGHT except for your heart, blood pressure & breathing medications. You may take them with a sip of clear liquids.    04/15/2020- Day of Procedure  Give yourself one Fleet enema about 1 hour prior to leaving for the hospital.   Diabetic medication adjustments for today:   You may take TYLENOL products. Please continue your regular medications unless we have instructed you otherwise.    Please note, on the day of your procedure you MUST be accompanied by an adult who is willing to assume responsibility for you at time of discharge. If you do not have such person with you, your  procedure will have to be rescheduled.                                                             Please leave ALL jewelry at home prior to coming to the hospital for your procedure.   *It is your responsibility to check with your insurance company for the benefits of coverage you have for this procedure. Unfortunately, not all insurance companies have benefits to cover all or part of these types of procedures. It is your responsibility to check your benefits, however we will be glad to assist you with any codes your insurance company may need.   Please note that most insurance companies will not cover a screening colonoscopy for people under the age of 57. For example, with some insurance companies you may have benefits for a screening colonoscopy, but if polyps are found the diagnosis will change and then you may have a deductible that will need to be met. Please make sure you check your benefits for screening colonoscopy as well as a diagnostic colonoscopy.    CLEAR LIQUIDS: (NO RED)  Jello Apple Juice White Grape Juice Water  Banana popsicles Kool-Aid Coffee(No cream or milk)  Tea (  No cream or milk) Soft drinks Broth (fat free beef/chicken/vegetable)   Clear liquids allow you to see your fingers on the other side of the glass. Be sure they are NOT RED in color, cloudy, but CLEAR.   Do Not Eat:  Dairy products of any kind Cranberry juice  Tomato or V8 Juice Orange Juice  Grapefruit Juice Red Grape Juice  Solid foods like cereal, oatmeal, yogurt, fruits, vegetables, creamed soups, eggs, bread, etc   HELPFUL HINTS TO MAKE DRINKING EASIER:  -Make sure prep is extremely COLD. Refrigerate the night before. You may also put in freezer.  -You may try mixing Crystal Light or Country Time Lemonade if you prefer. MIx in small amounts. Add more if necessary.  -Trying drinking through a straw.  -Rinse mouth with water or mouthwash  between glasses to remove aftertaste.  -Try sipping on a cold beverage/ice popsicles between glasses of prep.  -Place a piece of sugar-free hard candy in mouth between glasses.  -If you become nauseated, try consuming smaller amounts or stretch out the time between glasses. Stop for 30 minutes to an hour & slowly start back drinking.   Call our office with any questions or concerns at [phone].   Thank You

## 2020-03-27 NOTE — Progress Notes (Addendum)
Gastroenterology Pre-Procedure Review  Request Date: 03/27/2020 Requesting Physician: Elvia Collum, DO @ [facility], Last TCS done 20 years ago somewhere in [place], pt could not remember physician who did it, no polyps per pt, family hx of colon cancer (great grandmother)  PATIENT REVIEW QUESTIONS: The patient responded to the following health history questions as indicated:    1. Diabetes Melitis: no 2. Joint replacements in the past 12 months: no 3. Major health problems in the past 3 months: no 4. Has an artificial valve or MVP: no 5. Has a defibrillator: no 6. Has been advised in past to take antibiotics in advance of a procedure like teeth cleaning: no 7. Family history of colon cancer: yes, great grandmother: age unknown  62. Alcohol Use: yes, 1 glass of wine a month 9. Illicit drug Use: no 10. History of sleep apnea: no  11. History of coronary artery or other vascular stents placed within the last 12 months: no 12. History of any prior anesthesia complications: no 13. Body mass index is 31.55 kg/m.    MEDICATIONS & ALLERGIES:    Patient reports the following regarding taking any blood thinners:   Plavix? no Aspirin? no Coumadin? no Brilinta? no Xarelto? no Eliquis? no Pradaxa? no Savaysa? no Effient? no  Patient confirms/reports the following medications:  Current Outpatient Medications  Medication Sig Dispense Refill  . acetaminophen (TYLENOL) 500 MG tablet Take 500 mg by mouth as needed.     Marland Kitchen amLODipine (NORVASC) 5 MG tablet Take 1 tablet (5 mg total) by mouth daily. 90 tablet 0  . B Complex Vitamins (BL VITAMIN B COMPLEX PO) Take by mouth daily.    . cetirizine-pseudoephedrine (ZYRTEC-D) 5-120 MG tablet Take 1 tablet by mouth daily.    Marland Kitchen FLUoxetine (PROZAC) 20 MG capsule fluoxetine 20 mg capsule  TAKE 1 CAPSULE BY MOUTH EVERY DAY    . verapamil (CALAN-SR) 180 MG CR tablet TAKE 1 TABLET BY MOUTH EVERY NIGHT AT BEDTIME (Patient taking differently: in  the morning. ) 90 tablet 0  . varenicline (CHANTIX CONTINUING MONTH PAK) 1 MG tablet take as directed ON PACKAGE (Patient not taking: Reported on 03/27/2020) 56 tablet 1  . varenicline (CHANTIX STARTING MONTH PAK) 0.5 MG X 11 & 1 MG X 42 tablet take as directed (Patient not taking: Reported on 03/27/2020) 53 tablet 0   No current facility-administered medications for this visit.    Patient confirms/reports the following allergies:  No Known Allergies  No orders of the defined types were placed in this encounter.   AUTHORIZATION INFORMATION Primary Insurance: [place],  [place] #: [id number],  Group #: [id number] Pre-Cert / Josem Kaufmann required: No, not required  SCHEDULE INFORMATION: Procedure has been scheduled as follows:  Date: 04/15/2020, Time: 10:00 Location: [facility] with Dr. Abbey Chatters  This Gastroenterology Pre-Precedure Review Form is being routed to the following provider(s): Roseanne Kaufman, NP

## 2020-04-03 NOTE — Progress Notes (Signed)
Appropriate. ASA 2.  

## 2020-04-11 ENCOUNTER — Encounter (HOSPITAL_COMMUNITY): Payer: Self-pay | Admitting: *Deleted

## 2020-04-12 ENCOUNTER — Other Ambulatory Visit (HOSPITAL_COMMUNITY)
Admission: RE | Admit: 2020-04-12 | Discharge: 2020-04-12 | Disposition: A | Discharge status: Home / Self Care | Payer: BC Managed Care – PPO | Source: Ambulatory Visit | Attending: Family Medicine | Admitting: Family Medicine

## 2020-04-12 ENCOUNTER — Other Ambulatory Visit: Payer: Self-pay

## 2020-04-12 DIAGNOSIS — Z01812 Encounter for preprocedural laboratory examination: Secondary | ICD-10-CM | POA: Diagnosis not present

## 2020-04-12 DIAGNOSIS — Z20822 Contact with and (suspected) exposure to covid-19: Secondary | ICD-10-CM | POA: Diagnosis not present

## 2020-04-13 LAB — SARS CORONAVIRUS 2 (TAT 6-24 HRS): SARS Coronavirus 2: NEGATIVE

## 2020-04-15 ENCOUNTER — Encounter (HOSPITAL_COMMUNITY): Payer: Self-pay

## 2020-04-15 ENCOUNTER — Ambulatory Visit (HOSPITAL_COMMUNITY): Payer: BC Managed Care – PPO | Admitting: Anesthesiology

## 2020-04-15 ENCOUNTER — Encounter (HOSPITAL_COMMUNITY)
Admission: AD | Disposition: A | Discharge status: Home / Self Care | Payer: Self-pay | Source: Home / Self Care | Attending: Internal Medicine

## 2020-04-15 ENCOUNTER — Ambulatory Visit (HOSPITAL_COMMUNITY)
Admission: AD | Admit: 2020-04-15 | Discharge: 2020-04-15 | Disposition: A | Discharge status: Home / Self Care | Payer: BC Managed Care – PPO | Attending: Internal Medicine | Admitting: Internal Medicine

## 2020-04-15 DIAGNOSIS — J302 Other seasonal allergic rhinitis: Secondary | ICD-10-CM | POA: Diagnosis not present

## 2020-04-15 DIAGNOSIS — I10 Essential (primary) hypertension: Secondary | ICD-10-CM | POA: Diagnosis not present

## 2020-04-15 DIAGNOSIS — F32A Depression, unspecified: Secondary | ICD-10-CM | POA: Diagnosis not present

## 2020-04-15 DIAGNOSIS — F419 Anxiety disorder, unspecified: Secondary | ICD-10-CM | POA: Insufficient documentation

## 2020-04-15 DIAGNOSIS — K621 Rectal polyp: Secondary | ICD-10-CM

## 2020-04-15 DIAGNOSIS — F17210 Nicotine dependence, cigarettes, uncomplicated: Secondary | ICD-10-CM | POA: Insufficient documentation

## 2020-04-15 DIAGNOSIS — K648 Other hemorrhoids: Secondary | ICD-10-CM | POA: Diagnosis not present

## 2020-04-15 DIAGNOSIS — K635 Polyp of colon: Secondary | ICD-10-CM | POA: Diagnosis not present

## 2020-04-15 DIAGNOSIS — Z1211 Encounter for screening for malignant neoplasm of colon: Secondary | ICD-10-CM | POA: Diagnosis not present

## 2020-04-15 DIAGNOSIS — Z79899 Other long term (current) drug therapy: Secondary | ICD-10-CM | POA: Insufficient documentation

## 2020-04-15 DIAGNOSIS — K5730 Diverticulosis of large intestine without perforation or abscess without bleeding: Secondary | ICD-10-CM | POA: Diagnosis not present

## 2020-04-15 HISTORY — PX: COLONOSCOPY WITH PROPOFOL: SHX5780

## 2020-04-15 HISTORY — DX: Anxiety disorder, unspecified: F41.9

## 2020-04-15 HISTORY — PX: BIOPSY: SHX5522

## 2020-04-15 HISTORY — DX: Essential (primary) hypertension: I10

## 2020-04-15 HISTORY — PX: POLYPECTOMY: SHX5525

## 2020-04-15 HISTORY — DX: Depression, unspecified: F32.A

## 2020-04-15 SURGERY — COLONOSCOPY WITH PROPOFOL
Anesthesia: General

## 2020-04-15 MED ORDER — LACTATED RINGERS IV SOLN
INTRAVENOUS | Status: DC
  Administered 2020-04-15: 1000 mL via INTRAVENOUS

## 2020-04-15 MED ORDER — PROPOFOL 500 MG/50ML IV EMUL
INTRAVENOUS | Status: DC | PRN
  Administered 2020-04-15: 150 ug/kg/min via INTRAVENOUS

## 2020-04-15 MED ORDER — STERILE WATER FOR IRRIGATION IR SOLN
Status: DC | PRN
  Administered 2020-04-15: 100 mL

## 2020-04-15 MED ORDER — PROPOFOL 10 MG/ML IV BOLUS
INTRAVENOUS | Status: DC | PRN
  Administered 2020-04-15: 50 mg via INTRAVENOUS
  Administered 2020-04-15: 40 mg via INTRAVENOUS
  Administered 2020-04-15: 50 mg via INTRAVENOUS
  Administered 2020-04-15: 40 mg via INTRAVENOUS
  Administered 2020-04-15: 10 mg via INTRAVENOUS

## 2020-04-15 NOTE — Anesthesia Postprocedure Evaluation (Signed)
Anesthesia Post Note  Patient: DAMARIS ABELN  Procedure(s) Performed: COLONOSCOPY WITH PROPOFOL (N/A ) BIOPSY POLYPECTOMY  Patient location during evaluation: Endoscopy Anesthesia Type: General Level of consciousness: awake and alert and oriented Pain management: pain level controlled Vital Signs Assessment: post-procedure vital signs reviewed and stable Respiratory status: spontaneous breathing Cardiovascular status: blood pressure returned to baseline and stable Postop Assessment: no apparent nausea or vomiting Anesthetic complications: no   No complications documented.   Last Vitals:  Vitals:   04/15/20 0853 04/15/20 0854  Temp: 36.7 C   SpO2:  99%    Last Pain:  Vitals:   04/15/20 1003  TempSrc:   PainSc: 0-No pain                 Evamaria Detore

## 2020-04-15 NOTE — Op Note (Signed)
[facility] Patient Name: Krista Davis Procedure Date: 04/15/2020 9:59 AM MRN: [id number] Date of Birth: [birth year] Attending MD: Elon Alas. Edgar Frisk CSN: [id number] Age: 54 Admit Type: Outpatient Procedure:                Colonoscopy Indications:              Screening for colorectal malignant neoplasm Providers:                Elon Alas. Abbey Chatters, DO, Crystal Page, Wynonia Musty Tech, Technician Referring MD:              Medicines:                See the Anesthesia note for documentation of the                            administered medications Complications:            No immediate complications. Estimated Blood Loss:     Estimated blood loss was minimal. Procedure:                Pre-Anesthesia Assessment:                           - The anesthesia plan was to use monitored                            anesthesia care (MAC).                           After obtaining informed consent, the colonoscope                            was passed under direct vision. Throughout the                            procedure, the patient's blood pressure, pulse, and                            oxygen saturations were monitored continuously. The                            PCF-H190DL ([id number]) scope was introduced through                            the anus and advanced to the the cecum, identified                            by appendiceal orifice and ileocecal valve. The                            colonoscopy was performed without difficulty. The                            patient tolerated the procedure well. The  quality                            of the bowel preparation was evaluated using the                            BBPS [facility] Bowel Preparation Scale) with scores                            of: Right Colon = 2 (minor amount of residual                            staining, small fragments of stool and/or opaque                            liquid, but  mucosa seen well), Transverse Colon = 2                            (minor amount of residual staining, small fragments                            of stool and/or opaque liquid, but mucosa seen                            well) and Left Colon = 2 (minor amount of residual                            staining, small fragments of stool and/or opaque                            liquid, but mucosa seen well). The total BBPS score                            equals 6. The quality of the bowel preparation was                            fair. Scope In: 10:09:26 AM Scope Out: 10:30:53 AM Scope Withdrawal Time: 0 hours 14 minutes 31 seconds  Total Procedure Duration: 0 hours 21 minutes 27 seconds  Findings:      The perianal and digital rectal examinations were normal.      Non-bleeding internal hemorrhoids were found during endoscopy.      Multiple small and large-mouthed diverticula were found in the entire       colon.      Multiple hyperplastic polyps were found in the sigmoid colon. The polyps       were diminutive in size. These polyps were removed with a cold biopsy       forceps. Resection and retrieval were complete.      A 5 mm polyp was found in the rectum. The polyp was sessile. The polyp       was removed with a cold snare. Resection and retrieval were complete.      The exam was otherwise without abnormality. Impression:               -  Preparation of the colon was fair.                           - Non-bleeding internal hemorrhoids.                           - Diverticulosis in the entire examined colon.                           - Multiple diminutive polyps in the sigmoid colon,                            removed with a cold biopsy forceps. Resected and                            retrieved.                           - One 5 mm polyp in the rectum, removed with a cold                            snare. Resected and retrieved.                           - The examination was otherwise  normal. Moderate Sedation:      Per Anesthesia Care Recommendation:           - Patient has a contact number available for                            emergencies. The signs and symptoms of potential                            delayed complications were discussed with the                            patient. Return to normal activities tomorrow.                            Written discharge instructions were provided to the                            patient.                           - Resume previous diet.                           - Continue present medications.                           - Await pathology results.                           - Repeat colonoscopy in 5 years for surveillance  and borderline colon prep.                           - Return to GI clinic PRN. Procedure Code(s):        --- Professional ---                           [phone], Colonoscopy, flexible; with removal of                            tumor(s), polyp(s), or other lesion(s) by snare                            technique                           45380, 43, Colonoscopy, flexible; with biopsy,                            single or multiple Diagnosis Code(s):        --- Professional ---                           Z12.11, Encounter for screening for malignant                            neoplasm of colon                           K64.8, Other hemorrhoids                           K63.5, Polyp of colon                           K62.1, Rectal polyp                           K57.30, Diverticulosis of large intestine without                            perforation or abscess without bleeding CPT copyright 2019 American Medical Association. All rights reserved. The codes documented in this report are preliminary and upon coder review may  be revised to meet current compliance requirements. Elon Alas. Abbey Chatters, DO [place], DO 04/15/2020 10:37:40 AM This report has been signed  electronically. Number of Addenda: 0

## 2020-04-15 NOTE — Discharge Instructions (Addendum)
Colonoscopy Discharge Instructions  Read the instructions outlined below and refer to this sheet in the next few weeks. These discharge instructions provide you with general information on caring for yourself after you leave the hospital. Your doctor may also give you specific instructions. While your treatment has been planned according to the most current medical practices available, unavoidable complications occasionally occur.   ACTIVITY  You may resume your regular activity, but move at a slower pace for the next 24 hours.   Take frequent rest periods for the next 24 hours.   Walking will help get rid of the air and reduce the bloated feeling in your belly (abdomen).   No driving for 24 hours (because of the medicine (anesthesia) used during the test).    Do not sign any important legal documents or operate any machinery for 24 hours (because of the anesthesia used during the test).  NUTRITION  Drink plenty of fluids.   You may resume your normal diet as instructed by your doctor.   Begin with a light meal and progress to your normal diet. Heavy or fried foods are harder to digest and may make you feel sick to your stomach (nauseated).   Avoid alcoholic beverages for 24 hours or as instructed.  MEDICATIONS  You may resume your normal medications unless your doctor tells you otherwise.  WHAT YOU CAN EXPECT TODAY  Some feelings of bloating in the abdomen.   Passage of more gas than usual.   Spotting of blood in your stool or on the toilet paper.  IF YOU HAD POLYPS REMOVED DURING THE COLONOSCOPY:  No aspirin products for 7 days or as instructed.   No alcohol for 7 days or as instructed.   Eat a soft diet for the next 24 hours.  FINDING OUT THE RESULTS OF YOUR TEST Not all test results are available during your visit. If your test results are not back during the visit, make an appointment with your caregiver to find out the results. Do not assume everything is normal  if you have not heard from your caregiver or the medical facility. It is important for you to follow up on all of your test results.  SEEK IMMEDIATE MEDICAL ATTENTION IF:  You have more than a spotting of blood in your stool.   Your belly is swollen (abdominal distention).   You are nauseated or vomiting.   You have a temperature over 101.   You have abdominal pain or discomfort that is severe or gets worse throughout the day.   Your colonoscopy revealed 4 polyp(s) which I removed successfully. Await pathology results, my office will contact you. I recommend repeating colonoscopy in 5 years for surveillance purposes and borderline colon prep.   You also have diverticulosis and internal hemorrhoids. I would recommend increasing fiber in your diet or adding OTC Benefiber/Metamucil. Be sure to drink at least 4 to 6 glasses of water daily. Follow-up with GI as needed.   I hope you have a great rest of your week!  Krista Davis. Abbey Chatters, D.O. Gastroenterology and Hepatology [facility]   Diverticulosis  Diverticulosis is a condition that develops when small pouches (diverticula) form in the wall of the large intestine (colon). The colon is where water is absorbed and stool (feces) is formed. The pouches form when the inside layer of the colon pushes through weak spots in the outer layers of the colon. You may have a few pouches or many of them. The pouches usually  do not cause problems unless they become inflamed or infected. When this happens, the condition is called diverticulitis. What are the causes? The cause of this condition is not known. What increases the risk? The following factors may make you more likely to develop this condition:  Being older than age 46. Your risk for this condition increases with age. Diverticulosis is rare among people younger than age 47. By age 70, many people have it.  Eating a low-fiber diet.  Having frequent  constipation.  Being overweight.  Not getting enough exercise.  Smoking.  Taking over-the-counter pain medicines, like aspirin and ibuprofen.  Having a family history of diverticulosis. What are the signs or symptoms? In most people, there are no symptoms of this condition. If you do have symptoms, they may include:  Bloating.  Cramps in the abdomen.  Constipation or diarrhea.  Pain in the lower left side of the abdomen. How is this diagnosed? Because diverticulosis usually has no symptoms, it is most often diagnosed during an exam for other colon problems. The condition may be diagnosed by:  Using a flexible scope to examine the colon (colonoscopy).  Taking an X-ray of the colon after dye has been put into the colon (barium enema).  Having a CT scan. How is this treated? You may not need treatment for this condition. Your health care provider may recommend treatment to prevent problems. You may need treatment if you have symptoms or if you previously had diverticulitis. Treatment may include:  Eating a high-fiber diet.  Taking a fiber supplement.  Taking a live bacteria supplement (probiotic).  Taking medicine to relax your colon. Follow these instructions at home: Medicines  Take over-the-counter and prescription medicines only as told by your health care provider.  If told by your health care provider, take a fiber supplement or probiotic. Constipation prevention Your condition may cause constipation. To prevent or treat constipation, you may need to:  Drink enough fluid to keep your urine pale yellow.  Take over-the-counter or prescription medicines.  Eat foods that are high in fiber, such as beans, whole grains, and fresh fruits and vegetables.  Limit foods that are high in fat and processed sugars, such as fried or sweet foods.  General instructions  Try not to strain when you have a bowel movement.  Keep all follow-up visits as told by your health  care provider. This is important. Contact a health care provider if you:  Have pain in your abdomen.  Have bloating.  Have cramps.  Have not had a bowel movement in 3 days. Get help right away if:  Your pain gets worse.  Your bloating becomes very bad.  You have a fever or chills, and your symptoms suddenly get worse.  You vomit.  You have bowel movements that are bloody or black.  You have bleeding from your rectum. Summary  Diverticulosis is a condition that develops when small pouches (diverticula) form in the wall of the large intestine (colon).  You may have a few pouches or many of them.  This condition is most often diagnosed during an exam for other colon problems.  Treatment may include increasing the fiber in your diet, taking supplements, or taking medicines. This information is not intended to replace advice given to you by your health care provider. Make sure you discuss any questions you have with your health care provider. Document Revised: 01/12/2019 Document Reviewed: 01/12/2019 Elsevier Patient Education  [place].  Colon Polyps  Polyps are tissue growths  inside the body. Polyps can grow in many places, including the large intestine (colon). A polyp may be a round bump or a mushroom-shaped growth. You could have one polyp or several. Most colon polyps are noncancerous (benign). However, some colon polyps can become cancerous over time. Finding and removing the polyps early can help prevent this. What are the causes? The exact cause of colon polyps is not known. What increases the risk? You are more likely to develop this condition if you:  Have a family history of colon cancer or colon polyps.  Are older than 36 or older than 45 if you are African American.  Have inflammatory bowel disease, such as ulcerative colitis or Crohn's disease.  Have certain hereditary conditions, such as: ? Familial adenomatous polyposis. ? Lynch  syndrome. ? Turcot syndrome. ? Peutz-Jeghers syndrome.  Are overweight.  Smoke cigarettes.  Do not get enough exercise.  Drink too much alcohol.  Eat a diet that is high in fat and red meat and low in fiber.  Had childhood cancer that was treated with abdominal radiation. What are the signs or symptoms? Most polyps do not cause symptoms. If you have symptoms, they may include:  Blood coming from your rectum when having a bowel movement.  Blood in your stool. The stool may look dark red or black.  Abdominal pain.  A change in bowel habits, such as constipation or diarrhea. How is this diagnosed? This condition is diagnosed with a colonoscopy. This is a procedure in which a lighted, flexible scope is inserted into the anus and then passed into the colon to examine the area. Polyps are sometimes found when a colonoscopy is done as part of routine cancer screening tests. How is this treated? Treatment for this condition involves removing any polyps that are found. Most polyps can be removed during a colonoscopy. Those polyps will then be tested for cancer. Additional treatment may be needed depending on the results of testing. Follow these instructions at home: Lifestyle  Maintain a healthy weight, or lose weight if recommended by your health care provider.  Exercise every day or as told by your health care provider.  Do not use any products that contain nicotine or tobacco, such as cigarettes and e-cigarettes. If you need help quitting, ask your health care provider.  If you drink alcohol, limit how much you have: ? 0-1 drink a day for women. ? 0-2 drinks a day for men.  Be aware of how much alcohol is in your drink. In the [place] one drink equals one 12 oz bottle of beer (355 mL), one 5 oz glass of wine (148 mL), or one 1 oz shot of hard liquor (44 mL). Eating and drinking   Eat foods that are high in fiber, such as fruits, vegetables, and whole grains.  Eat foods that  are high in calcium and vitamin D, such as milk, cheese, yogurt, eggs, liver, fish, and broccoli.  Limit foods that are high in fat, such as fried foods and desserts.  Limit the amount of red meat and processed meat you eat, such as hot dogs, sausage, bacon, and lunch meats. General instructions  Keep all follow-up visits as told by your health care provider. This is important. ? This includes having regularly scheduled colonoscopies. ? Talk to your health care provider about when you need a colonoscopy. Contact a health care provider if:  You have new or worsening bleeding during a bowel movement.  You have new or increased blood in  your stool.  You have a change in bowel habits.  You lose weight for no known reason. Summary  Polyps are tissue growths inside the body. Polyps can grow in many places, including the colon.  Most colon polyps are noncancerous (benign), but some can become cancerous over time.  This condition is diagnosed with a colonoscopy.  Treatment for this condition involves removing any polyps that are found. Most polyps can be removed during a colonoscopy. This information is not intended to replace advice given to you by your health care provider. Make sure you discuss any questions you have with your health care provider. Document Revised: 09/30/2017 Document Reviewed: 09/30/2017 Elsevier Patient Education  [place].  Hemorrhoids Hemorrhoids are swollen veins that may develop:  In the butt (rectum). These are called internal hemorrhoids.  Around the opening of the butt (anus). These are called external hemorrhoids. Hemorrhoids can cause pain, itching, or bleeding. Most of the time, they do not cause serious problems. They usually get better with diet changes, lifestyle changes, and other home treatments. What are the causes? This condition may be caused by:  Having trouble pooping (constipation).  Pushing hard (straining) to poop.  Watery  poop (diarrhea).  Pregnancy.  Being very overweight (obese).  Sitting for long periods of time.  Heavy lifting or other activity that causes you to strain.  Anal sex.  Riding a bike for a long period of time. What are the signs or symptoms? Symptoms of this condition include:  Pain.  Itching or soreness in the butt.  Bleeding from the butt.  Leaking poop.  Swelling in the area.  One or more lumps around the opening of your butt. How is this diagnosed? A doctor can often diagnose this condition by looking at the affected area. The doctor may also:  Do an exam that involves feeling the area with a gloved hand (digital rectal exam).  Examine the area inside your butt using a small tube (anoscope).  Order blood tests. This may be done if you have lost a lot of blood.  Have you get a test that involves looking inside the colon using a flexible tube with a camera on the end (sigmoidoscopy or colonoscopy). How is this treated? This condition can usually be treated at home. Your doctor may tell you to change what you eat, make lifestyle changes, or try home treatments. If these do not help, procedures can be done to remove the hemorrhoids or make them smaller. These may involve:  Placing rubber bands at the base of the hemorrhoids to cut off their blood supply.  Injecting medicine into the hemorrhoids to shrink them.  Shining a type of light energy onto the hemorrhoids to cause them to fall off.  Doing surgery to remove the hemorrhoids or cut off their blood supply. Follow these instructions at home: Eating and drinking   Eat foods that have a lot of fiber in them. These include whole grains, beans, nuts, fruits, and vegetables.  Ask your doctor about taking products that have added fiber (fibersupplements).  Reduce the amount of fat in your diet. You can do this by: ? Eating low-fat dairy products. ? Eating less red meat. ? Avoiding processed foods.  Drink enough  fluid to keep your pee (urine) pale yellow. Managing pain and swelling   Take a warm-water bath (sitz bath) for 20 minutes to ease pain. Do this 3-4 times a day. You may do this in a bathtub or using a portable sitz  bath that fits over the toilet.  If told, put ice on the painful area. It may be helpful to use ice between your warm baths. ? Put ice in a plastic bag. ? Place a towel between your skin and the bag. ? Leave the ice on for 20 minutes, 2-3 times a day. General instructions  Take over-the-counter and prescription medicines only as told by your doctor. ? Medicated creams and medicines may be used as told.  Exercise often. Ask your doctor how much and what kind of exercise is best for you.  Go to the bathroom when you have the urge to poop. Do not wait.  Avoid pushing too hard when you poop.  Keep your butt dry and clean. Use wet toilet paper or moist towelettes after pooping.  Do not sit on the toilet for a long time.  Keep all follow-up visits as told by your doctor. This is important. Contact a doctor if you:  Have pain and swelling that do not get better with treatment or medicine.  Have trouble pooping.  Cannot poop.  Have pain or swelling outside the area of the hemorrhoids. Get help right away if you have:  Bleeding that will not stop. Summary  Hemorrhoids are swollen veins in the butt or around the opening of the butt.  They can cause pain, itching, or bleeding.  Eat foods that have a lot of fiber in them. These include whole grains, beans, nuts, fruits, and vegetables.  Take a warm-water bath (sitz bath) for 20 minutes to ease pain. Do this 3-4 times a day. This information is not intended to replace advice given to you by your health care provider. Make sure you discuss any questions you have with your health care provider. Document Revised: 06/23/2018 Document Reviewed: 11/04/2017 Elsevier Patient Education  [place].

## 2020-04-15 NOTE — H&P (Signed)
Primary Care Physician:  Erven Colla, DO Primary Gastroenterologist:  Dr. Abbey Chatters  Pre-Procedure History & Physical: HPI:  Krista Davis is a 54 y.o. female is here for a screening colonoscopy.   Past Medical History:  Diagnosis Date  . Anxiety   . Depression   . Hypertension   . No abnormality seen   . Seasonal allergies     Past Surgical History:  Procedure Laterality Date  . CARPAL TUNNEL RELEASE  2004   left  . CARPAL TUNNEL RELEASE Right 06/27/2013   Procedure: RIGHT CARPAL TUNNEL RELEASE;  Surgeon: Wynonia Sours, MD;  Location: [facility];  Service: Orthopedics;  Laterality: Right;  . CESAREAN SECTION  2004  . ENDOMETRIAL ABLATION  2009    Prior to Admission medications   Medication Sig Start Date End Date Taking? Authorizing Provider  B Complex Vitamins (BL VITAMIN B COMPLEX PO) Take 2 tablets by mouth daily.    Yes [provider]  cetirizine-pseudoephedrine (ZYRTEC-D) 5-120 MG tablet Take 1 tablet by mouth daily.   Yes [provider]  FLUoxetine (PROZAC) 20 MG capsule Take 20 mg by mouth daily.    Yes [provider]  ibuprofen (ADVIL) 200 MG tablet Take 400-800 mg by mouth every 6 (six) hours as needed for mild pain or moderate pain.   Yes [provider]  Turmeric Curcumin 500 MG CAPS Take 500 mg by mouth daily.   Yes [provider]  verapamil (CALAN-SR) 180 MG CR tablet TAKE 1 TABLET BY MOUTH EVERY NIGHT AT BEDTIME Patient taking differently: Take 180 mg by mouth in the morning.  03/06/20  Yes Kathyrn Drown, MD  varenicline (CHANTIX CONTINUING MONTH PAK) 1 MG tablet take as directed ON PACKAGE Patient not taking: Reported on 03/27/2020 01/15/20   Elvia Collum M, DO  varenicline (CHANTIX STARTING MONTH PAK) 0.5 MG X 11 & 1 MG X 42 tablet take as directed Patient not taking: Reported on 03/27/2020 01/15/20   Elvia Collum M, DO    Allergies as of 04/03/2020  . (No Known Allergies)    Family  History  Problem Relation Age of Onset  . Colon cancer Other     Social History   Socioeconomic History  . Marital status: Legally Separated    Spouse name: Not on file  . Number of children: Not on file  . Years of education: Not on file  . Highest education level: Not on file  Occupational History  . Not on file  Tobacco Use  . Smoking status: Current Every Day Smoker    Packs/day: 0.50  . Smokeless tobacco: Never Used  Vaping Use  . Vaping Use: Never used  Substance and Sexual Activity  . Alcohol use: Yes    Comment: occ  . Drug use: No  . Sexual activity: Not on file  Other Topics Concern  . Not on file  Social History Narrative  . Not on file   Social Determinants of Health   Financial Resource Strain:   . Difficulty of Paying Living Expenses: Not on file  Food Insecurity:   . Worried About Charity fundraiser in the Last Year: Not on file  . Ran Out of Food in the Last Year: Not on file  Transportation Needs:   . Lack of Transportation (Medical): Not on file  . Lack of Transportation (Non-Medical): Not on file  Physical Activity:   . Days of Exercise per Week: Not on file  .  Minutes of Exercise per Session: Not on file  Stress:   . Feeling of Stress : Not on file  Social Connections:   . Frequency of Communication with Friends and Family: Not on file  . Frequency of Social Gatherings with Friends and Family: Not on file  . Attends Religious Services: Not on file  . Active Member of Clubs or Organizations: Not on file  . Attends Archivist Meetings: Not on file  . Marital Status: Not on file  Intimate Partner Violence:   . Fear of Current or Ex-Partner: Not on file  . Emotionally Abused: Not on file  . Physically Abused: Not on file  . Sexually Abused: Not on file    Review of Systems: See HPI, otherwise negative ROS  Impression/Plan: Krista Davis is here for a colonoscopy to be performed for screening  Risks, benefits, limitations,  imponderables and alternatives regarding colonoscopy have been reviewed with the patient. Questions have been answered. All parties agreeable.

## 2020-04-15 NOTE — Transfer of Care (Signed)
Immediate Anesthesia Transfer of Care Note  Patient: Krista Davis  Procedure(s) Performed: COLONOSCOPY WITH PROPOFOL (N/A ) BIOPSY POLYPECTOMY  Patient Location: PACU  Anesthesia Type:General  Level of Consciousness: awake  Airway & Oxygen Therapy: Patient Spontanous Breathing  Post-op Assessment: Report given to RN  Post vital signs: Reviewed and stable  Last Vitals:  Vitals Value Taken Time  BP    Temp    Pulse    Resp    SpO2      Last Pain:  Vitals:   04/15/20 1003  TempSrc:   PainSc: 0-No pain      Patients Stated Pain Goal: 6 (40/35/24 8185)  Complications: No complications documented.

## 2020-04-15 NOTE — Anesthesia Preprocedure Evaluation (Signed)
Anesthesia Evaluation  Patient identified by MRN, date of birth, ID band Patient awake    Reviewed: Allergy & Precautions, H&P , NPO status , Patient's Chart, lab work & pertinent test results, reviewed documented beta blocker date and time   Airway Mallampati: II  TM Distance: >3 FB Neck ROM: full    Dental no notable dental hx.    Pulmonary neg pulmonary ROS, Current Smoker,    Pulmonary exam normal breath sounds clear to auscultation       Cardiovascular Exercise Tolerance: Good hypertension, negative cardio ROS   Rhythm:regular Rate:Normal     Neuro/Psych PSYCHIATRIC DISORDERS Anxiety Depression negative neurological ROS     GI/Hepatic negative GI ROS, Neg liver ROS,   Endo/Other  negative endocrine ROS  Renal/GU negative Renal ROS  negative genitourinary   Musculoskeletal   Abdominal   Peds  Hematology negative hematology ROS (+)   Anesthesia Other Findings   Reproductive/Obstetrics negative OB ROS                             Anesthesia Physical Anesthesia Plan  ASA: II  Anesthesia Plan: General   Post-op Pain Management:    Induction:   PONV Risk Score and Plan: Propofol infusion  Airway Management Planned:   Additional Equipment:   Intra-op Plan:   Post-operative Plan:   Informed Consent: I have reviewed the patients History and Physical, chart, labs and discussed the procedure including the risks, benefits and alternatives for the proposed anesthesia with the patient or authorized representative who has indicated his/her understanding and acceptance.     Dental Advisory Given  Plan Discussed with: CRNA  Anesthesia Plan Comments:         Anesthesia Quick Evaluation

## 2020-04-16 LAB — SURGICAL PATHOLOGY

## 2020-04-18 ENCOUNTER — Encounter (HOSPITAL_COMMUNITY): Payer: Self-pay | Admitting: Internal Medicine

## 2020-04-19 ENCOUNTER — Encounter: Payer: Self-pay | Admitting: Family Medicine

## 2020-04-19 DIAGNOSIS — K635 Polyp of colon: Secondary | ICD-10-CM | POA: Insufficient documentation

## 2020-04-23 ENCOUNTER — Telehealth: Payer: Self-pay | Admitting: Family Medicine

## 2020-04-24 NOTE — Telephone Encounter (Signed)
Patient states she had not started the medication yet because she has been working thru some issues. Patient states she does not want an alternative at this time and will discuss further at her follow up in January

## 2020-06-07 DIAGNOSIS — Z1152 Encounter for screening for COVID-19: Secondary | ICD-10-CM | POA: Diagnosis not present

## 2020-07-17 ENCOUNTER — Other Ambulatory Visit: Payer: Self-pay

## 2020-07-17 ENCOUNTER — Encounter: Payer: Self-pay | Admitting: Family Medicine

## 2020-07-17 ENCOUNTER — Ambulatory Visit: Payer: BC Managed Care – PPO | Admitting: Family Medicine

## 2020-07-17 VITALS — BP 147/90 | HR 83 | Temp 97.6°F | Ht 60.0 in | Wt 165.0 lb

## 2020-07-17 DIAGNOSIS — I10 Essential (primary) hypertension: Secondary | ICD-10-CM | POA: Diagnosis not present

## 2020-07-17 DIAGNOSIS — E7849 Other hyperlipidemia: Secondary | ICD-10-CM

## 2020-07-17 MED ORDER — VERAPAMIL HCL ER 180 MG PO TBCR: 180.0000 mg | EXTENDED_RELEASE_TABLET | Freq: Every day | ORAL | 1 refills | Status: DC

## 2020-07-17 NOTE — Patient Instructions (Addendum)
Labs this week or next week, fasting.

## 2020-07-17 NOTE — Progress Notes (Signed)
Patient ID: Krista Davis, female    DOB: 11-Jan-1966, 55 y.o.   MRN: [id number]   Chief Complaint  Patient presents with  . Hypertension   Subjective:    HPI   HTN- check up. Pt states no concerns. Needs refills on verapamil.   Pt going through a divorce. Living with her mother and daughter for past 1.5 yrs. Sees specialist for fluoxetine. Seeing  Dr. Orpah Greek, gyn.  Pt didn't take bp meds this am.  Pt tried verapamil 180mg  SR and then went onto norvasc and not sure why she discontinued, then went back to verapamil 180mg  SR.  Daughter going through some things and needing to see counselor, trying to find a psychiatrist.  Pt not sure if husband has bipolar or just depression.  Thinking her daughter might have some of that.  Medical History Kayona has a past medical history of Anxiety, Depression, Hypertension, No abnormality seen, and Seasonal allergies.   Outpatient Encounter Medications as of 07/17/2020  Medication Sig  . B Complex Vitamins (BL VITAMIN B COMPLEX PO) Take 2 tablets by mouth daily.   . cetirizine-pseudoephedrine (ZYRTEC-D) 5-120 MG tablet Take 1 tablet by mouth daily.  Marland Kitchen FLUoxetine (PROZAC) 20 MG capsule Take 20 mg by mouth daily.   Marland Kitchen ibuprofen (ADVIL) 200 MG tablet Take 400-800 mg by mouth every 6 (six) hours as needed for mild pain or moderate pain.  . Turmeric Curcumin 500 MG CAPS Take 500 mg by mouth daily.  . [DISCONTINUED] verapamil (CALAN-SR) 180 MG CR tablet TAKE 1 TABLET BY MOUTH EVERY NIGHT AT BEDTIME (Patient taking differently: Take 180 mg by mouth in the morning.)  . verapamil (CALAN-SR) 180 MG CR tablet Take 1 tablet (180 mg total) by mouth at bedtime.   No facility-administered encounter medications on file as of 07/17/2020.     Review of Systems  Constitutional: Negative for chills and fever.  HENT: Negative for congestion, rhinorrhea and sore throat.   Respiratory: Negative for cough, shortness of breath and wheezing.   Cardiovascular:  Negative for chest pain and leg swelling.  Gastrointestinal: Negative for abdominal pain, diarrhea, nausea and vomiting.  Genitourinary: Negative for dysuria and frequency.  Musculoskeletal: Negative for arthralgias and back pain.  Skin: Negative for rash.  Neurological: Negative for dizziness, weakness and headaches.  Psychiatric/Behavioral: Negative for dysphoric mood, self-injury, sleep disturbance and suicidal ideas. The patient is not nervous/anxious.      Vitals BP (!) 147/90 Comment: did not take bp med this morning  Pulse 83   Temp 97.6 F (36.4 C)   Ht 5' (1.524 m)   Wt 165 lb (74.8 kg)   SpO2 99%   BMI 32.22 kg/m   Objective:   Physical Exam Vitals and nursing note reviewed.  Constitutional:      Appearance: Normal appearance.  HENT:     Head: Normocephalic and atraumatic.     Nose: Nose normal.     Mouth/Throat:     Mouth: Mucous membranes are moist.     Pharynx: Oropharynx is clear.  Eyes:     Extraocular Movements: Extraocular movements intact.     Conjunctiva/sclera: Conjunctivae normal.     Pupils: Pupils are equal, round, and reactive to light.  Cardiovascular:     Rate and Rhythm: Normal rate and regular rhythm.     Pulses: Normal pulses.     Heart sounds: Normal heart sounds.  Pulmonary:     Effort: Pulmonary effort is normal.     Breath sounds:  Normal breath sounds. No wheezing, rhonchi or rales.  Musculoskeletal:        General: Normal range of motion.     Right lower leg: No edema.     Left lower leg: No edema.  Skin:    General: Skin is warm and dry.     Findings: No lesion or rash.  Neurological:     General: No focal deficit present.     Mental Status: She is alert and oriented to person, place, and time.     Cranial Nerves: No cranial nerve deficit.  Psychiatric:        Mood and Affect: Mood normal.        Behavior: Behavior normal.        Thought Content: Thought content normal.        Judgment: Judgment normal.      Assessment  and Plan   1. Hypertension, unspecified type - verapamil (CALAN-SR) 180 MG CR tablet; Take 1 tablet (180 mg total) by mouth at bedtime.  Dispense: 90 tablet; Refill: 1 - Basic metabolic panel - Lipid panel; Future  2. Other hyperlipidemia - Lipid panel; Future    htn- pt didn't take medication today. bp uncontrolled.  Pt to take medications when she gets home.  hld- elevated on last visit.  Will recheck.  cont to eat low chole and inc exercising.   F/u 14mo or prn.

## 2020-08-16 DIAGNOSIS — Z1389 Encounter for screening for other disorder: Secondary | ICD-10-CM | POA: Diagnosis not present

## 2020-08-16 DIAGNOSIS — Z1231 Encounter for screening mammogram for malignant neoplasm of breast: Secondary | ICD-10-CM | POA: Diagnosis not present

## 2020-08-16 DIAGNOSIS — Z130 Encounter for screening for diseases of the blood and blood-forming organs and certain disorders involving the immune mechanism: Secondary | ICD-10-CM | POA: Diagnosis not present

## 2020-08-16 DIAGNOSIS — Z01419 Encounter for gynecological examination (general) (routine) without abnormal findings: Secondary | ICD-10-CM | POA: Diagnosis not present

## 2020-08-16 DIAGNOSIS — Z6827 Body mass index (BMI) 27.0-27.9, adult: Secondary | ICD-10-CM | POA: Diagnosis not present

## 2020-09-02 ENCOUNTER — Other Ambulatory Visit: Payer: Self-pay | Admitting: Family Medicine

## 2020-09-02 DIAGNOSIS — I10 Essential (primary) hypertension: Secondary | ICD-10-CM

## 2020-11-14 DIAGNOSIS — H02826 Cysts of left eye, unspecified eyelid: Secondary | ICD-10-CM | POA: Diagnosis not present

## 2021-01-06 ENCOUNTER — Other Ambulatory Visit: Payer: Self-pay

## 2021-01-06 ENCOUNTER — Encounter: Payer: Self-pay | Admitting: Family Medicine

## 2021-01-06 ENCOUNTER — Ambulatory Visit: Payer: BC Managed Care – PPO | Admitting: Family Medicine

## 2021-01-06 VITALS — BP 134/84 | HR 95 | Temp 98.2°F | Wt 170.8 lb

## 2021-01-06 DIAGNOSIS — J329 Chronic sinusitis, unspecified: Secondary | ICD-10-CM | POA: Diagnosis not present

## 2021-01-06 DIAGNOSIS — E785 Hyperlipidemia, unspecified: Secondary | ICD-10-CM

## 2021-01-06 DIAGNOSIS — Z9889 Other specified postprocedural states: Secondary | ICD-10-CM | POA: Diagnosis not present

## 2021-01-06 DIAGNOSIS — I10 Essential (primary) hypertension: Secondary | ICD-10-CM | POA: Diagnosis not present

## 2021-01-06 DIAGNOSIS — E1169 Type 2 diabetes mellitus with other specified complication: Secondary | ICD-10-CM

## 2021-01-06 MED ORDER — VERAPAMIL HCL ER 180 MG PO TBCR: EXTENDED_RELEASE_TABLET | ORAL | 1 refills | Status: DC

## 2021-01-06 MED ORDER — LISINOPRIL 10 MG PO TABS: 10.0000 mg | ORAL_TABLET | Freq: Every day | ORAL | 1 refills | Status: DC

## 2021-01-06 NOTE — Progress Notes (Signed)
Patient ID: Krista Davis, female    DOB: May 25, 1966, 55 y.o.   MRN: [id number]   Chief Complaint  Patient presents with   Hypertension   Subjective:    HPI Pt here for HTN. Pt states blood pressure has been elevated. Pt is wanting recommendations on OTC meds that she can take with blood pressure med. Also, possibly changing blood pressure med. Pt has had headache, blurred vision. Pt did begin Zyrtec D in March and had headache all allergy season.   Having sinus headaches and been taking zyrtec D. Seeing some 151/104 at home. Has gained some weight over the past year.  HTN Pt compliant with BP meds.  No SEs Denies chest pain, sob, LE swelling.  Does have some vision changes.  Medical History Braylei has a past medical history of Anxiety, Depression, Hypertension, No abnormality seen, and Seasonal allergies.   Outpatient Encounter Medications as of 01/06/2021  Medication Sig   B Complex Vitamins (BL VITAMIN B COMPLEX PO) Take 2 tablets by mouth daily.    cetirizine-pseudoephedrine (ZYRTEC-D) 5-120 MG tablet Take 1 tablet by mouth daily.   FLUoxetine (PROZAC) 20 MG capsule Take 20 mg by mouth daily.    ibuprofen (ADVIL) 200 MG tablet Take 400-800 mg by mouth every 6 (six) hours as needed for mild pain or moderate pain.   lisinopril (ZESTRIL) 10 MG tablet Take 1 tablet (10 mg total) by mouth daily.   Turmeric Curcumin 500 MG CAPS Take 500 mg by mouth daily.   [DISCONTINUED] verapamil (CALAN-SR) 180 MG CR tablet TAKE 1 TABLET(180 MG) BY MOUTH AT BEDTIME   verapamil (CALAN-SR) 180 MG CR tablet TAKE 1 TABLET(180 MG) BY MOUTH AT BEDTIME   No facility-administered encounter medications on file as of 01/06/2021.     Review of Systems  Constitutional:  Negative for chills and fever.  HENT:  Negative for congestion, rhinorrhea and sore throat.   Eyes:  Positive for visual disturbance.  Respiratory:  Negative for cough, shortness of breath and wheezing.   Cardiovascular:   Negative for chest pain and leg swelling.  Gastrointestinal:  Negative for abdominal pain, diarrhea, nausea and vomiting.  Genitourinary:  Negative for dysuria and frequency.  Musculoskeletal:  Negative for arthralgias and back pain.  Skin:  Negative for rash.  Neurological:  Positive for headaches. Negative for dizziness and weakness.    Vitals BP 134/84   Pulse 95   Temp 98.2 F (36.8 C)   Wt 170 lb 12.8 oz (77.5 kg)   SpO2 97%   BMI 33.36 kg/m   Objective:   Physical Exam Vitals and nursing note reviewed.  Constitutional:      General: She is not in acute distress.    Appearance: Normal appearance. She is not ill-appearing.  HENT:     Head: Normocephalic and atraumatic.     Nose: Nose normal.     Mouth/Throat:     Mouth: Mucous membranes are moist.     Pharynx: Oropharynx is clear.  Eyes:     Extraocular Movements: Extraocular movements intact.     Conjunctiva/sclera: Conjunctivae normal.     Pupils: Pupils are equal, round, and reactive to light.  Cardiovascular:     Rate and Rhythm: Normal rate and regular rhythm.     Pulses: Normal pulses.     Heart sounds: Normal heart sounds.  Pulmonary:     Effort: Pulmonary effort is normal.     Breath sounds: Normal breath sounds. No wheezing, rhonchi  or rales.  Musculoskeletal:        General: Normal range of motion.     Right lower leg: No edema.     Left lower leg: No edema.  Skin:    General: Skin is warm and dry.     Findings: No lesion or rash.  Neurological:     General: No focal deficit present.     Mental Status: She is alert and oriented to person, place, and time.  Psychiatric:        Mood and Affect: Mood normal.        Behavior: Behavior normal.     Assessment and Plan   1. Hypertension, unspecified type - CMP14+EGFR - lisinopril (ZESTRIL) 10 MG tablet; Take 1 tablet (10 mg total) by mouth daily.  Dispense: 90 tablet; Refill: 1 - verapamil (CALAN-SR) 180 MG CR tablet; TAKE 1 TABLET(180 MG) BY  MOUTH AT BEDTIME  Dispense: 90 tablet; Refill: 1  2. Hyperlipidemia, unspecified hyperlipidemia type - CBC - CMP14+EGFR - Lipid panel  3. History of myringotomy  4. Chronic sinusitis, unspecified location   Htn- suboptimal. Dec salt in diet,  Inc exercising to help with losing weight Stopping decongestant otc causing some increase in bp.  Will cont to monitior. Advising pt to take coricidin or plain zyrtec or claritin for allergies.  Pt to check bp at home and if seeing elevated bp over 145/85, then pt can take additional 32m lisinopril.  Anxiety- Seeing gyn for prozac.  Chronic sinusitis- cont to monitor.  Otc allergy meds.  F/u with ent if needed. Biaxin was given in past for sinus infections that went to her ears.  Had to get tubes in ears 20 yrs ago.   Return in about 6 months (around 07/09/2021) for fu htn.  Wt Readings from Last 3 Encounters:  01/06/21 170 lb 12.8 oz (77.5 kg)  07/17/20 165 lb (74.8 kg)  03/27/20 164 lb 12.8 oz (74.8 kg)

## 2021-01-06 NOTE — Patient Instructions (Signed)
Goal is 120/80. If having 145/85 or over, call the office or under 100/70.  Take extra lisinopril 10mg  in am with other meds.

## 2021-02-04 ENCOUNTER — Telehealth: Payer: Self-pay | Admitting: Family Medicine

## 2021-02-04 NOTE — Telephone Encounter (Signed)
Pt tested positive for COVID last Thursday. Pt had fever that broke Saturday night. Also had headache, runny nose, stuffy head and cough that sounds deep at times. No trouble breathing or shortness of breath. Pt ears are now stopped up and pt states she has history of sinus infection and ear infection and worries when ears become stopped up. Pt has been using Ibuprofen but is wondering what else she should do. Please advise. Thank you

## 2021-02-04 NOTE — Telephone Encounter (Signed)
Pt contacted. Pt informed to try Sudafed and Flonase daily. Call back to office if not helping. Pt verbalized understanding.

## 2021-02-04 NOTE — Telephone Encounter (Signed)
Please advise where to place patient. Schedule looks booked for rest of the week. Please advise. Thank you!

## 2021-03-14 ENCOUNTER — Ambulatory Visit (INDEPENDENT_AMBULATORY_CARE_PROVIDER_SITE_OTHER): Payer: BC Managed Care – PPO | Admitting: Nurse Practitioner

## 2021-03-14 ENCOUNTER — Other Ambulatory Visit: Payer: Self-pay

## 2021-03-14 DIAGNOSIS — J069 Acute upper respiratory infection, unspecified: Secondary | ICD-10-CM | POA: Diagnosis not present

## 2021-03-14 DIAGNOSIS — B9689 Other specified bacterial agents as the cause of diseases classified elsewhere: Secondary | ICD-10-CM | POA: Diagnosis not present

## 2021-03-14 MED ORDER — HYDROCOD POLST-CPM POLST ER 10-8 MG/5ML PO SUER: 5.0000 mL | Freq: Every evening | ORAL | 0 refills | Status: DC | PRN

## 2021-03-14 MED ORDER — CLARITHROMYCIN 500 MG PO TABS: 500.0000 mg | ORAL_TABLET | Freq: Two times a day (BID) | ORAL | 0 refills | Status: DC

## 2021-03-14 NOTE — Progress Notes (Signed)
   Subjective:    Patient ID: Krista Davis, female    DOB: 1966-05-14, 55 y.o.   MRN: [id number]  HPI  Patient states he had Covid last month and still having a really bad cough all the time and even coughing in her sleep and not sleeping well. Patient states it is a dry cough and she coughs till she gags Diagnosed on 8/3 with a positive COVID test.  Has had this cough since then.  States she has been coughing all the time.  Was better for a while and got worse over the past week.  Now having difficulty sleeping at night due to extreme coughing.  No wheezing.  Had a negative COVID test last night.  Mostly nonproductive.  Describes as a harsh cough.  No fever.  Slight sore throat.  No ear pain.  Some itching and popping in ears at times.  Continues to smoke less than half a pack per day.  Denies any abdominal pain or acid reflux symptoms.  No itchy watery eyes or sneezing.  Occasional mild headache around the forehead and eyes.       Objective:   Physical Exam NAD.  Alert, oriented.  TMs retracted, no erythema.  Pharynx nonerythematous and moist, slight PND noted.  Neck supple with mild soft anterior adenopathy.  Occasional harsh congested cough noted.  No tachypnea.  Normal color.  Lungs clear.  Heart regular rate rhythm.  Today's Vitals   03/14/21 1600  SpO2: 99%   There is no height or weight on file to calculate BMI.        Assessment & Plan:  Bacterial upper respiratory infection Meds ordered this encounter  Medications   clarithromycin (BIAXIN) 500 MG tablet    Sig: Take 1 tablet (500 mg total) by mouth 2 (two) times daily.    Dispense:  20 tablet    Refill:  0    Order Specific Question:   Supervising Provider    Answer:   Sallee Lange A [[id number]]   chlorpheniramine-HYDROcodone (TUSSIONEX PENNKINETIC ER) 10-8 MG/5ML SUER    Sig: Take 5 mLs by mouth at bedtime as needed for cough.    Dispense:  70 mL    Refill:  0    Order Specific Question:   Supervising Provider     Answer:   Sallee Lange A [[id number]]   Patient states she has done well on Biaxin in the past. OTC meds as directed for daytime cough.  Tussionex cough syrup for nighttime cough. Warning signs reviewed.  Patient to call back in 7 to 10 days if no improvement, sooner if worse.

## 2021-03-15 ENCOUNTER — Encounter: Payer: Self-pay | Admitting: Nurse Practitioner

## 2021-04-23 ENCOUNTER — Telehealth: Payer: Self-pay | Admitting: Nurse Practitioner

## 2021-04-23 NOTE — Telephone Encounter (Signed)
Patient is requesting refill on hydrocodone cough syrup she was seen 03/14/21 for upper respiratory and bad cough  she states just getting over covid from august but still had cough and now she still has cough and wanting refill. [place] please advise

## 2021-04-24 MED ORDER — PROMETHAZINE-DM 6.25-15 MG/5ML PO SYRP: 5.0000 mL | ORAL_SOLUTION | Freq: Four times a day (QID) | ORAL | 0 refills | Status: DC | PRN

## 2021-04-24 NOTE — Telephone Encounter (Signed)
Patient notified and verbalized understanding. 

## 2021-04-24 NOTE — Telephone Encounter (Signed)
Coral Spikes, DO    I sent in an Rx for Promethazine DM; do not recommend additional Tussionex.

## 2021-06-25 ENCOUNTER — Ambulatory Visit: Payer: BC Managed Care – PPO | Admitting: Family Medicine

## 2021-06-27 ENCOUNTER — Ambulatory Visit: Payer: BC Managed Care – PPO | Admitting: Family Medicine

## 2021-06-27 ENCOUNTER — Encounter: Payer: Self-pay | Admitting: Family Medicine

## 2021-06-27 ENCOUNTER — Ambulatory Visit (HOSPITAL_COMMUNITY)
Admission: RE | Admit: 2021-06-27 | Discharge: 2021-06-27 | Disposition: A | Discharge status: Home / Self Care | Payer: BC Managed Care – PPO | Source: Ambulatory Visit | Attending: Family Medicine | Admitting: Family Medicine

## 2021-06-27 ENCOUNTER — Other Ambulatory Visit: Payer: Self-pay

## 2021-06-27 VITALS — BP 150/75 | HR 89 | Temp 98.1°F | Ht 60.0 in | Wt 177.0 lb

## 2021-06-27 DIAGNOSIS — R051 Acute cough: Secondary | ICD-10-CM

## 2021-06-27 DIAGNOSIS — F17200 Nicotine dependence, unspecified, uncomplicated: Secondary | ICD-10-CM

## 2021-06-27 DIAGNOSIS — Z8616 Personal history of COVID-19: Secondary | ICD-10-CM

## 2021-06-27 DIAGNOSIS — R059 Cough, unspecified: Secondary | ICD-10-CM | POA: Diagnosis not present

## 2021-06-27 MED ORDER — PANTOPRAZOLE SODIUM 40 MG PO TBEC: 40.0000 mg | DELAYED_RELEASE_TABLET | Freq: Every day | ORAL | 3 refills | Status: DC

## 2021-06-27 MED ORDER — ADVAIR HFA 115-21 MCG/ACT IN AERO: 2.0000 | INHALATION_SPRAY | Freq: Two times a day (BID) | RESPIRATORY_TRACT | 12 refills | Status: DC

## 2021-06-27 NOTE — Progress Notes (Signed)
° °  Subjective:    Patient ID: LAELANI VASKO, female    DOB: 07-03-1965, 55 y.o.   MRN: [id number]  HPI Ongoing dry cough since covid infection of 01/2021 has been taking promethazine at bed time Just dealing with it during the day Patient thinks she has been coughing some before the COVID infection But since the COVID infection she has had significant spells of type coughing in the upper airway feels like her throat is closing in She had videos of her coughing spells She denies bringing up any phlegm Denies any high fever chills Gets a little bit out of breath if she pushes her self but not severely out of breath No hemoptysis  Review of Systems     Objective:   Physical Exam Lungs are clear respiratory rate normal HEENT benign       Assessment & Plan:  Long discussion held today with patient regarding her ongoing cough X-ray indicated Patient may well benefit from having further work-up including pulmonary referral Possible ENT referral Also may benefit from pulmonary function test and potentially CT scan She was started on Advair to address any bronchial component She was started on Protonix to address any potential silent reflux causing the ongoing chronic cough Doubt that her chronic cough is due to lisinopril but that may be an issue as well if further work-ups are negative

## 2021-06-27 NOTE — Patient Instructions (Signed)
Is Advair 2 puffs twice daily Rinse mouth after use with water  Also Protonix 1 daily  Follow-up with Dr. Lacinda Axon in 2 to 3 weeks  You may do your chest x-ray at the hospital we will have results by early next week  Also consider whether or not you want to utilize Wellbutrin to help quit smoking.  If you do you would need to stop Prozac.  Please remember on follow-up if your cough is still giving you trouble we may need to do ENT and pulmonary consult with the possibility of pulmonary function test and CAT scan.  [facility]-Dr. Nicki Reaper

## 2021-07-05 ENCOUNTER — Other Ambulatory Visit: Payer: Self-pay | Admitting: Family Medicine

## 2021-07-05 DIAGNOSIS — I10 Essential (primary) hypertension: Secondary | ICD-10-CM

## 2021-07-11 ENCOUNTER — Other Ambulatory Visit: Payer: Self-pay

## 2021-07-11 ENCOUNTER — Ambulatory Visit: Payer: BC Managed Care – PPO | Admitting: Family Medicine

## 2021-07-11 ENCOUNTER — Encounter: Payer: Self-pay | Admitting: Family Medicine

## 2021-07-11 VITALS — BP 118/74 | HR 83 | Ht 60.0 in | Wt 180.0 lb

## 2021-07-11 DIAGNOSIS — G44209 Tension-type headache, unspecified, not intractable: Secondary | ICD-10-CM

## 2021-07-11 DIAGNOSIS — R053 Chronic cough: Secondary | ICD-10-CM | POA: Diagnosis not present

## 2021-07-11 NOTE — Patient Instructions (Signed)
Stop the lisinopril. Let me know how things are going in the next 2 weeks to 1 month.  Tylenol as needed for headache.  Take care  Dr. Lacinda Axon

## 2021-07-13 DIAGNOSIS — R053 Chronic cough: Secondary | ICD-10-CM | POA: Insufficient documentation

## 2021-07-13 DIAGNOSIS — R519 Headache, unspecified: Secondary | ICD-10-CM | POA: Insufficient documentation

## 2021-07-13 NOTE — Assessment & Plan Note (Signed)
Persistent and worsening. No improvement with Advair and protonix.  Suspect that this is secondary to ACEi. Stopping Lisinopril. Patient to monitor BP closely.

## 2021-07-13 NOTE — Assessment & Plan Note (Signed)
Consistent with tension headache. Lisinopril could be contributing.  Stopping lisinopril.  Tylenol and/or Ibuprofen as needed.

## 2021-07-13 NOTE — Progress Notes (Signed)
Subjective:  Patient ID: Krista Davis, female    DOB: 04/19/1966  Age: 56 y.o. MRN: [id number]  CC: Chief Complaint  Patient presents with   2 week follow up cough    Dry cough spells lasting 5 to 10 mins each , has a headache on temples most days and takes ibu when bad enough     HPI:  56 year old female presents for follow up.  Patient reports ongoing dry cough since 01/2021. Had [place] in August. Somewhere around that time was started on Lisinopril as well.  Has paroxysms of cough that last 5-10 minutes. Recently started on Advair and PPI. These have not help. No fever. No SOB. She is a smoker.  Also reports frequent headache. Location: bitemporal. No features of migraine. Occurs nearly daily.   Patient Active Problem List   Diagnosis Date Noted   Chronic cough 07/13/2021   Headache 07/13/2021   Chronic sinusitis 01/06/2021   Hyperplastic colon polyp 04/19/2020    Social Hx   Social History   Socioeconomic History   Marital status: Legally Separated    Spouse name: Not on file   Number of children: Not on file   Years of education: Not on file   Highest education level: Not on file  Occupational History   Not on file  Tobacco Use   Smoking status: Every Day    Packs/day: 0.50    Types: Cigarettes   Smokeless tobacco: Never  Vaping Use   Vaping Use: Never used  Substance and Sexual Activity   Alcohol use: Yes    Comment: occ   Drug use: No   Sexual activity: Not on file  Other Topics Concern   Not on file  Social History Narrative   Not on file   Social Determinants of Health   Financial Resource Strain: Not on file  Food Insecurity: Not on file  Transportation Needs: Not on file  Physical Activity: Not on file  Stress: Not on file  Social Connections: Not on file    Review of Systems Per HPI  Objective:  BP 118/74    Pulse 83    Ht 5' (1.524 m)    Wt 180 lb (81.6 kg)    SpO2 96%    BMI 35.15 kg/m   BP/Weight 07/11/2021 06/27/2021 12/30/5007   Systolic BP 381 829 937  Diastolic BP 74 75 84  Wt. (Lbs) 180 177 170.8  BMI 35.15 34.57 33.36    Physical Exam Constitutional:      Appearance: Normal appearance.  HENT:     Head: Normocephalic and atraumatic.  Eyes:     General:        Right eye: No discharge.        Left eye: No discharge.     Conjunctiva/sclera: Conjunctivae normal.  Cardiovascular:     Rate and Rhythm: Normal rate and regular rhythm.  Pulmonary:     Effort: Pulmonary effort is normal.     Breath sounds: Normal breath sounds. No wheezing, rhonchi or rales.  Neurological:     Mental Status: She is alert.  Psychiatric:        Mood and Affect: Mood normal.        Behavior: Behavior normal.    Lab Results  Component Value Date   WBC 5.6 01/11/2020   HGB 15.6 01/11/2020   HCT 45.4 01/11/2020   PLT 274 01/11/2020   GLUCOSE 107 (H) 01/11/2020   CHOL 260 (H) 01/11/2020  TRIG 242 (H) 01/11/2020   HDL 59 01/11/2020   LDLCALC 157 (H) 01/11/2020   ALT 16 01/11/2020   AST 17 01/11/2020   NA 140 01/11/2020   K 5.6 (H) 01/11/2020   CL 105 01/11/2020   CREATININE 0.82 01/11/2020   BUN 13 01/11/2020   CO2 21 01/11/2020   TSH 2.200 01/11/2020     Assessment & Plan:   Problem List Items Addressed This Visit       Other   Chronic cough - Primary    Persistent and worsening. No improvement with Advair and protonix.  Suspect that this is secondary to ACEi. Stopping Lisinopril. Patient to monitor BP closely.       Headache    Consistent with tension headache. Lisinopril could be contributing.  Stopping lisinopril.  Tylenol and/or Ibuprofen as needed.      Follow-up:  Patient to let me know how she is doing in the next 2 weeks to 1 month.  [place]

## 2021-07-22 ENCOUNTER — Other Ambulatory Visit: Payer: Self-pay | Admitting: Family Medicine

## 2021-07-22 DIAGNOSIS — I10 Essential (primary) hypertension: Secondary | ICD-10-CM

## 2021-07-28 ENCOUNTER — Other Ambulatory Visit: Payer: Self-pay | Admitting: Family Medicine

## 2021-10-12 ENCOUNTER — Other Ambulatory Visit: Payer: Self-pay | Admitting: Family Medicine

## 2021-10-12 DIAGNOSIS — I10 Essential (primary) hypertension: Secondary | ICD-10-CM

## 2021-11-11 ENCOUNTER — Other Ambulatory Visit: Payer: Self-pay | Admitting: Family Medicine

## 2021-11-28 DIAGNOSIS — Z1389 Encounter for screening for other disorder: Secondary | ICD-10-CM | POA: Diagnosis not present

## 2021-11-28 DIAGNOSIS — Z6830 Body mass index (BMI) 30.0-30.9, adult: Secondary | ICD-10-CM | POA: Diagnosis not present

## 2021-11-28 DIAGNOSIS — Z01419 Encounter for gynecological examination (general) (routine) without abnormal findings: Secondary | ICD-10-CM | POA: Diagnosis not present

## 2021-11-28 DIAGNOSIS — Z130 Encounter for screening for diseases of the blood and blood-forming organs and certain disorders involving the immune mechanism: Secondary | ICD-10-CM | POA: Diagnosis not present

## 2021-11-28 DIAGNOSIS — Z1231 Encounter for screening mammogram for malignant neoplasm of breast: Secondary | ICD-10-CM | POA: Diagnosis not present

## 2021-12-12 DIAGNOSIS — R922 Inconclusive mammogram: Secondary | ICD-10-CM | POA: Diagnosis not present

## 2021-12-12 DIAGNOSIS — R928 Other abnormal and inconclusive findings on diagnostic imaging of breast: Secondary | ICD-10-CM | POA: Diagnosis not present

## 2021-12-15 ENCOUNTER — Ambulatory Visit: Payer: BC Managed Care – PPO | Admitting: Family Medicine

## 2021-12-15 VITALS — BP 156/92 | HR 76 | Temp 98.8°F | Ht 60.0 in | Wt 185.0 lb

## 2021-12-15 DIAGNOSIS — I10 Essential (primary) hypertension: Secondary | ICD-10-CM

## 2021-12-15 DIAGNOSIS — K219 Gastro-esophageal reflux disease without esophagitis: Secondary | ICD-10-CM

## 2021-12-15 DIAGNOSIS — Z130 Encounter for screening for diseases of the blood and blood-forming organs and certain disorders involving the immune mechanism: Secondary | ICD-10-CM | POA: Diagnosis not present

## 2021-12-15 DIAGNOSIS — E782 Mixed hyperlipidemia: Secondary | ICD-10-CM | POA: Diagnosis not present

## 2021-12-15 MED ORDER — AMLODIPINE-OLMESARTAN 5-20 MG PO TABS: 1.0000 | ORAL_TABLET | Freq: Every day | ORAL | 3 refills | Status: DC

## 2021-12-15 MED ORDER — PANTOPRAZOLE SODIUM 40 MG PO TBEC: 40.0000 mg | DELAYED_RELEASE_TABLET | Freq: Every day | ORAL | 3 refills | Status: DC

## 2021-12-15 NOTE — Progress Notes (Signed)
Subjective:  Patient ID: Krista Davis, female    DOB: 21-Jan-1966  Age: 56 y.o. MRN: [id number]  CC: Chief Complaint  Patient presents with   Hypertension    High readings at home 160/111 headaches   Gastroesophageal Reflux    Metallic taste in mouth when eat certain things since last had pantoprazole in may     HPI:  56 year old female presents for evaluation of the above.  Patient's previous visit, lisinopril was stopped due to chronic cough.  Her cough has improved.  However, her blood pressures have been elevated.  She is currently taking verapamil.  She states that she has had associated headaches.  She would like to discuss this today.  Patient also reports ongoing reflux.  She was previously on pantoprazole and would like to resume.  Patient Active Problem List   Diagnosis Date Noted   Mixed hyperlipidemia 12/15/2021   Essential hypertension 12/15/2021   GERD (gastroesophageal reflux disease) 12/15/2021   Chronic sinusitis 01/06/2021   Hyperplastic colon polyp 04/19/2020    Social Hx   Social History   Socioeconomic History   Marital status: Legally Separated    Spouse name: Not on file   Number of children: Not on file   Years of education: Not on file   Highest education level: Not on file  Occupational History   Not on file  Tobacco Use   Smoking status: Every Day    Packs/day: 0.50    Types: Cigarettes   Smokeless tobacco: Never  Vaping Use   Vaping Use: Never used  Substance and Sexual Activity   Alcohol use: Yes    Comment: occ   Drug use: No   Sexual activity: Not on file  Other Topics Concern   Not on file  Social History Narrative   Not on file   Social Determinants of Health   Financial Resource Strain: Not on file  Food Insecurity: Not on file  Transportation Needs: Not on file  Physical Activity: Not on file  Stress: Not on file  Social Connections: Not on file    Review of Systems  Constitutional: Negative.   Neurological:   Positive for headaches.    Objective:  BP (!) 156/92   Pulse 76   Temp 98.8 F (37.1 C)   Ht 5' (1.524 m)   Wt 185 lb (83.9 kg)   SpO2 98%   BMI 36.13 kg/m      12/15/2021    2:56 PM 07/11/2021    2:55 PM 06/27/2021   11:25 AM  BP/Weight  Systolic BP 341 962 229  Diastolic BP 92 74 75  Wt. (Lbs) 185 180 177  BMI 36.13 kg/m2 35.15 kg/m2 34.57 kg/m2    Physical Exam Vitals and nursing note reviewed.  Constitutional:      General: She is not in acute distress.    Appearance: Normal appearance. She is not ill-appearing.  Eyes:     General:        Right eye: No discharge.        Left eye: No discharge.     Conjunctiva/sclera: Conjunctivae normal.  Cardiovascular:     Rate and Rhythm: Normal rate and regular rhythm.  Pulmonary:     Effort: Pulmonary effort is normal.     Breath sounds: Normal breath sounds. No wheezing, rhonchi or rales.  Neurological:     Mental Status: She is alert.  Psychiatric:        Mood and Affect: Mood normal.  Behavior: Behavior normal.     Lab Results  Component Value Date   WBC 5.6 01/11/2020   HGB 15.6 01/11/2020   HCT 45.4 01/11/2020   PLT 274 01/11/2020   GLUCOSE 107 (H) 01/11/2020   CHOL 260 (H) 01/11/2020   TRIG 242 (H) 01/11/2020   HDL 59 01/11/2020   LDLCALC 157 (H) 01/11/2020   ALT 16 01/11/2020   AST 17 01/11/2020   NA 140 01/11/2020   K 5.6 (H) 01/11/2020   CL 105 01/11/2020   CREATININE 0.82 01/11/2020   BUN 13 01/11/2020   CO2 21 01/11/2020   TSH 2.200 01/11/2020     Assessment & Plan:   Problem List Items Addressed This Visit       Cardiovascular and Mediastinum   Essential hypertension - Primary    Uncontrolled.  Stopping verapamil.  Starting amlodipine/olmesartan.  Labs in 2 weeks.  Follow-up in 3 months      Relevant Medications   amLODipine-olmesartan (AZOR) 5-20 MG tablet   Other Relevant Orders   CMP14+EGFR     Digestive   GERD (gastroesophageal reflux disease)    Restarting  Protonix.      Relevant Medications   pantoprazole (PROTONIX) 40 MG tablet     Other   Mixed hyperlipidemia   Relevant Medications   amLODipine-olmesartan (AZOR) 5-20 MG tablet   Other Relevant Orders   Lipid panel   Other Visit Diagnoses     Screening for deficiency anemia       Relevant Orders   CBC       Meds ordered this encounter  Medications   pantoprazole (PROTONIX) 40 MG tablet    Sig: Take 1 tablet (40 mg total) by mouth daily.    Dispense:  90 tablet    Refill:  3   amLODipine-olmesartan (AZOR) 5-20 MG tablet    Sig: Take 1 tablet by mouth daily.    Dispense:  90 tablet    Refill:  3    Follow-up: 3 months  [place] DO [place]

## 2021-12-15 NOTE — Patient Instructions (Signed)
Medication as prescribed.  Labs in ~ 2 weeks.   Follow up in 3 months.  Take care  Dr. Lacinda Axon

## 2021-12-15 NOTE — Assessment & Plan Note (Addendum)
Uncontrolled.  Stopping verapamil.  Starting amlodipine/olmesartan.  Labs in 2 weeks.  Follow-up in 3 months

## 2021-12-15 NOTE — Assessment & Plan Note (Signed)
Restarting Protonix.

## 2022-03-26 DIAGNOSIS — Z23 Encounter for immunization: Secondary | ICD-10-CM | POA: Diagnosis not present

## 2022-06-01 IMAGING — DX DG CHEST 2V
2 series · 2 of 2 positions shown · non-contrast
Comparison: 6891

CLINICAL DATA: cough

EXAM:
CHEST - 2 VIEW

[chest pa]
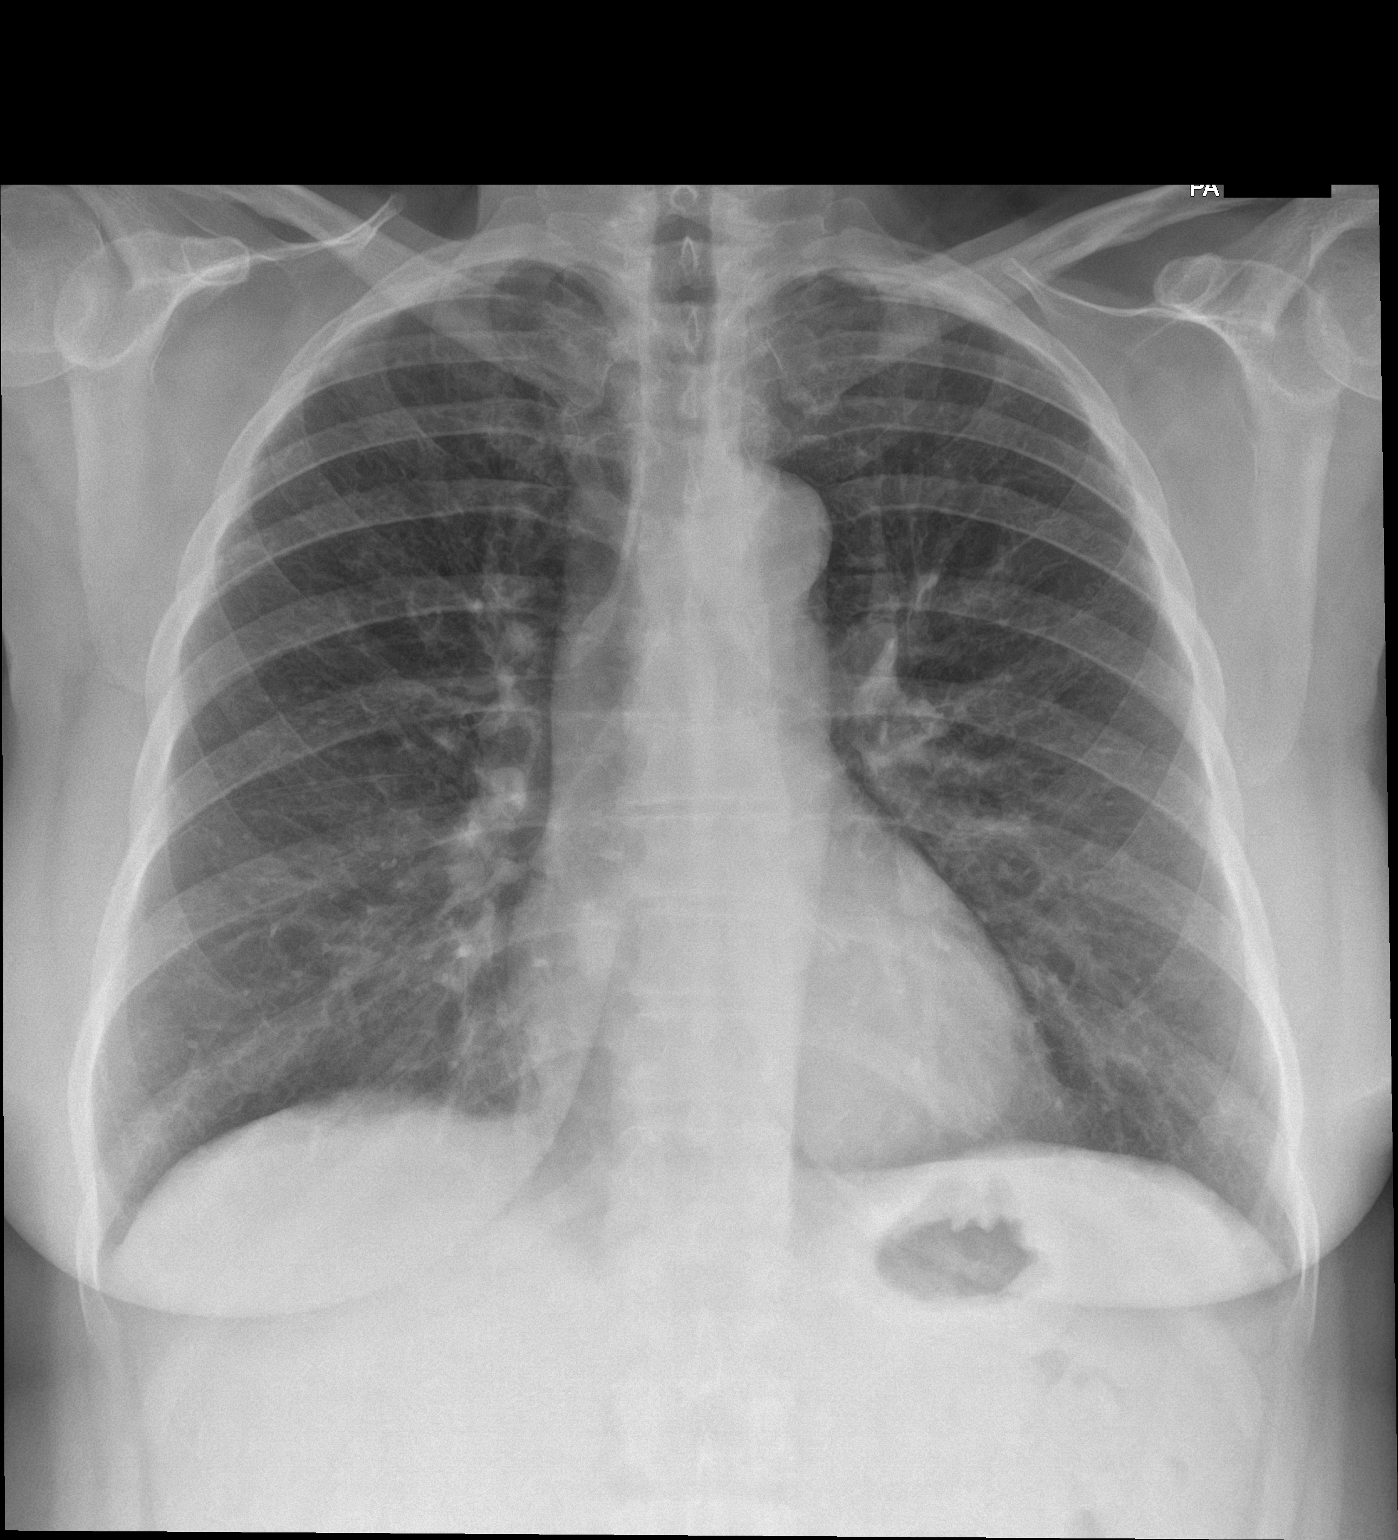

[chest lat]
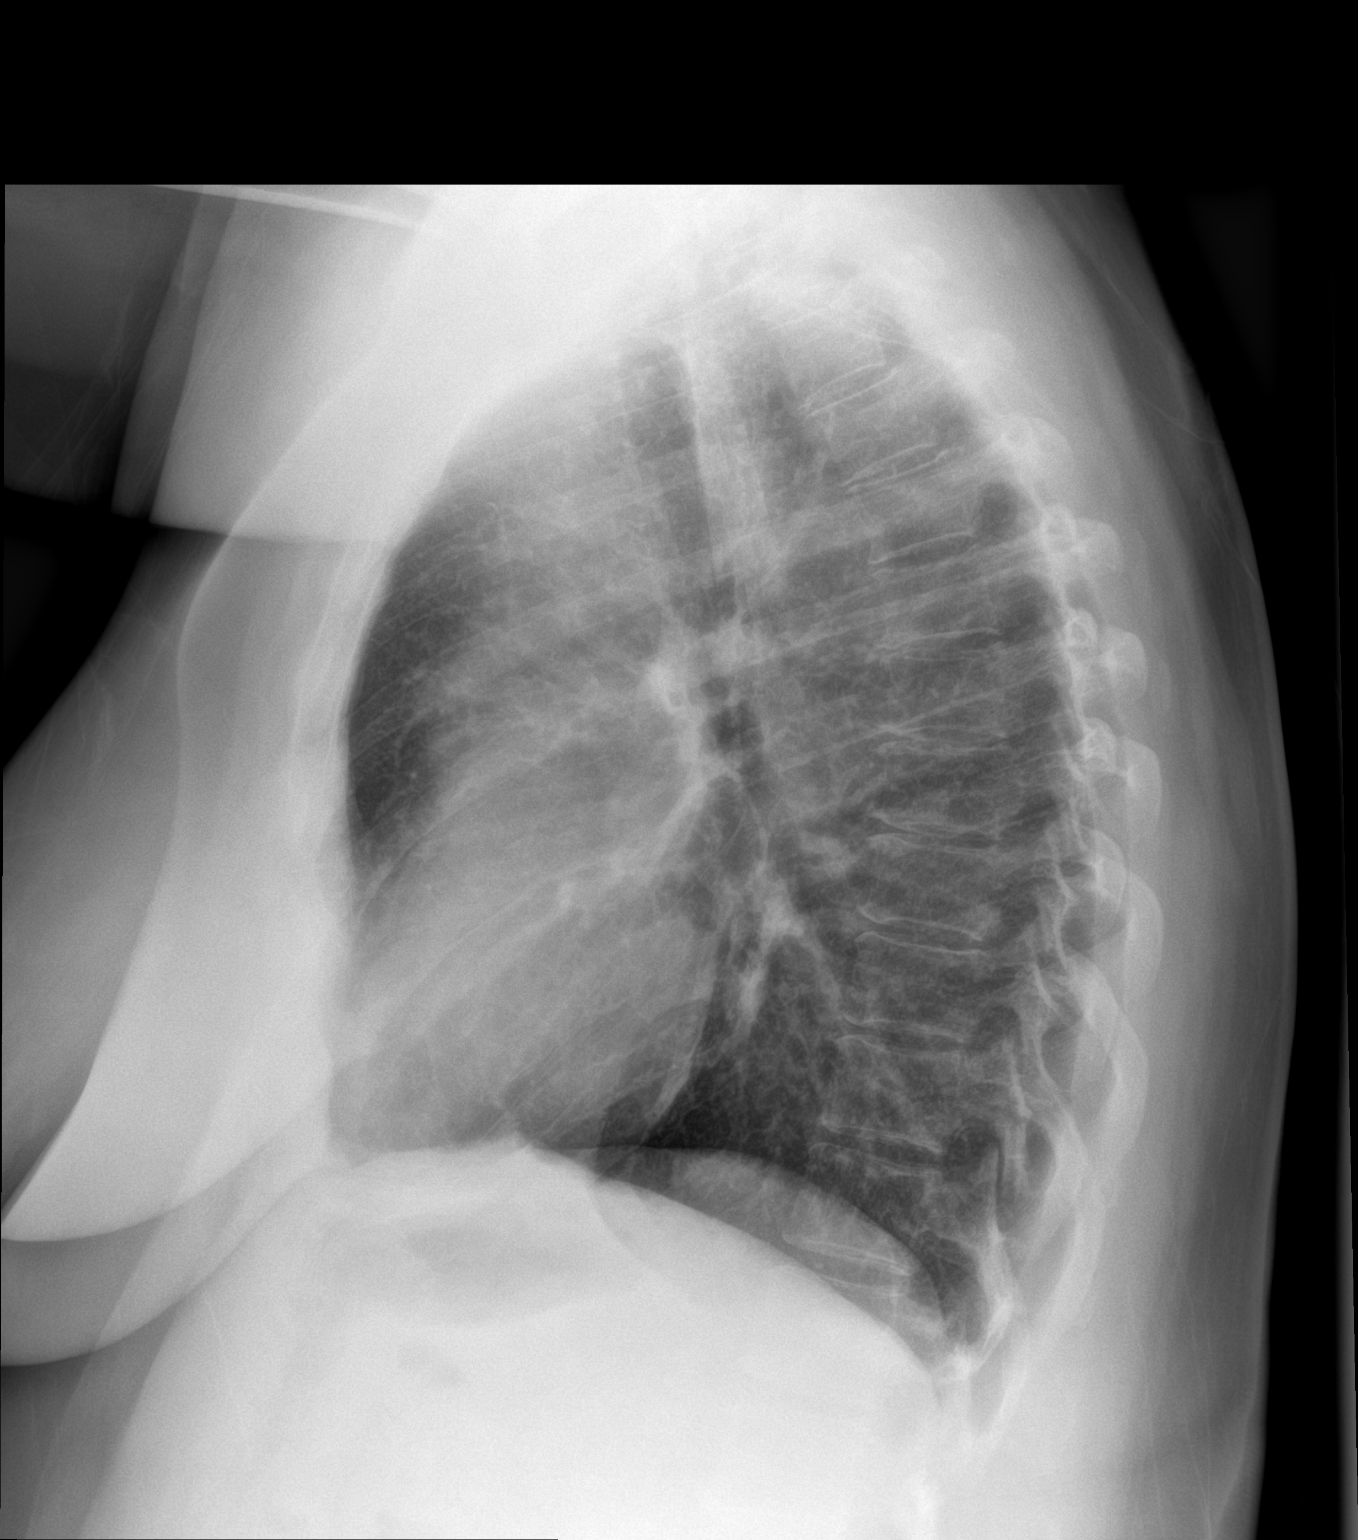

[2 of 2 positions shown; findings below may reference images not displayed]

FINDINGS: The cardiomediastinal silhouette is within normal limits. No pleural
effusion. No pneumothorax. No mass or consolidation. No acute
osseous abnormality.
IMPRESSION: No acute findings in the chest.

## 2022-06-18 ENCOUNTER — Ambulatory Visit: Payer: BC Managed Care – PPO | Admitting: Family Medicine

## 2022-06-18 VITALS — BP 134/80 | HR 91 | Temp 98.4°F | Wt 172.2 lb

## 2022-06-18 DIAGNOSIS — I10 Essential (primary) hypertension: Secondary | ICD-10-CM | POA: Diagnosis not present

## 2022-06-18 DIAGNOSIS — E782 Mixed hyperlipidemia: Secondary | ICD-10-CM

## 2022-06-18 MED ORDER — AMLODIPINE-OLMESARTAN 5-20 MG PO TABS: 1.0000 | ORAL_TABLET | Freq: Every day | ORAL | 3 refills | Status: DC

## 2022-06-18 NOTE — Progress Notes (Signed)
 Subjective:  Patient ID: Krista Davis, female    DOB: 09/25/1965  Age: 56 y.o. MRN: [id number]  CC: Chief Complaint  Patient presents with   Follow-up    Blood pressure meds    HPI:  56-year-old female with hypertension, hyperlipidemia, GERD presents for follow-up regarding her hypertension.  Patient is currently on amlodipine/olmesartan.  Patient did not get labs as previously recommended.  Patient has had difficulty getting her medication refilled by the pharmacy for some reason despite having refills on the medication.  She states that it is working well.  She feels much improved on this medication.  She would like to continue.  Needs labs today.  Patient Active Problem List   Diagnosis Date Noted   Mixed hyperlipidemia 12/15/2021   Essential hypertension 12/15/2021   GERD (gastroesophageal reflux disease) 12/15/2021   Chronic sinusitis 01/06/2021   Hyperplastic colon polyp 04/19/2020    Social Hx   Social History   Socioeconomic History   Marital status: Legally Separated    Spouse name: Not on file   Number of children: Not on file   Years of education: Not on file   Highest education level: Not on file  Occupational History   Not on file  Tobacco Use   Smoking status: Every Day    Packs/day: 0.50    Types: Cigarettes   Smokeless tobacco: Never  Vaping Use   Vaping Use: Never used  Substance and Sexual Activity   Alcohol use: Yes    Comment: occ   Drug use: No   Sexual activity: Not on file  Other Topics Concern   Not on file  Social History Narrative   Not on file   Social Determinants of Health   Financial Resource Strain: Not on file  Food Insecurity: Not on file  Transportation Needs: Not on file  Physical Activity: Not on file  Stress: Not on file  Social Connections: Not on file    Review of Systems  Constitutional: Negative.   Respiratory: Negative.      Objective:  BP 134/80   Pulse 91   Temp 98.4 F (36.9 C) (Oral)   Wt 172  lb 3.2 oz (78.1 kg)   SpO2 98%   BMI 33.63 kg/m      06/18/2022    3:27 PM 12/15/2021    2:56 PM 07/11/2021    2:55 PM  BP/Weight  Systolic BP 134 156 118  Diastolic BP 80 92 74  Wt. (Lbs) 172.2 185 180  BMI 33.63 kg/m2 36.13 kg/m2 35.15 kg/m2    Physical Exam Vitals and nursing note reviewed.  Constitutional:      General: She is not in acute distress.    Appearance: Normal appearance.  HENT:     Head: Normocephalic and atraumatic.  Eyes:     General:        Right eye: No discharge.        Left eye: No discharge.     Conjunctiva/sclera: Conjunctivae normal.  Cardiovascular:     Rate and Rhythm: Normal rate and regular rhythm.  Pulmonary:     Effort: Pulmonary effort is normal.     Breath sounds: Normal breath sounds.  Neurological:     Mental Status: She is alert.  Psychiatric:        Mood and Affect: Mood normal.        Behavior: Behavior normal.     Lab Results  Component Value Date   WBC 5.6 01/11/2020     HGB 15.6 01/11/2020   HCT 45.4 01/11/2020   PLT 274 01/11/2020   GLUCOSE 107 (H) 01/11/2020   CHOL 260 (H) 01/11/2020   TRIG 242 (H) 01/11/2020   HDL 59 01/11/2020   LDLCALC 157 (H) 01/11/2020   ALT 16 01/11/2020   AST 17 01/11/2020   NA 140 01/11/2020   K 5.6 (H) 01/11/2020   CL 105 01/11/2020   CREATININE 0.82 01/11/2020   BUN 13 01/11/2020   CO2 21 01/11/2020   TSH 2.200 01/11/2020     Assessment & Plan:   Problem List Items Addressed This Visit       Cardiovascular and Mediastinum   Essential hypertension - Primary    Well-controlled.  Medication was refilled.  Advised patient that she needs to get labs monitor creatinine and potassium.  Patient is in agreement and will get labs done today.      Relevant Medications   amLODipine-olmesartan (AZOR) 5-20 MG tablet   Other Relevant Orders   CMP14+EGFR     Other   Mixed hyperlipidemia   Relevant Medications   amLODipine-olmesartan (AZOR) 5-20 MG tablet   Other Relevant Orders    Lipid panel    Meds ordered this encounter  Medications   amLODipine-olmesartan (AZOR) 5-20 MG tablet    Sig: Take 1 tablet by mouth daily.    Dispense:  90 tablet    Refill:  3    Follow-up:  Return in about 6 months (around 12/18/2022).  [place]

## 2022-06-18 NOTE — Assessment & Plan Note (Signed)
Well-controlled.  Medication was refilled.  Advised patient that she needs to get labs monitor creatinine and potassium.  Patient is in agreement and will get labs done today.

## 2022-06-18 NOTE — Patient Instructions (Addendum)
Labs today.  I have refilled the medication. Please let me know if you have any trouble.  Follow up in 6 months.

## 2022-06-19 LAB — CMP14+EGFR
ALT: 23 IU/L (ref 0–32)
AST: 19 IU/L (ref 0–40)
Albumin/Globulin Ratio: 2.4 — ABNORMAL HIGH (ref 1.2–2.2)
Albumin: 4.7 g/dL (ref 3.8–4.9)
Alkaline Phosphatase: 99 IU/L (ref 44–121)
BUN/Creatinine Ratio: 17 (ref 9–23)
BUN: 13 mg/dL (ref 6–24)
Bilirubin Total: 0.2 mg/dL (ref 0.0–1.2)
CO2: 20 mmol/L (ref 20–29)
Calcium: 9.6 mg/dL (ref 8.7–10.2)
Chloride: 105 mmol/L (ref 96–106)
Creatinine, Ser: 0.78 mg/dL (ref 0.57–1.00)
Globulin, Total: 2 g/dL (ref 1.5–4.5)
Glucose: 105 mg/dL — ABNORMAL HIGH (ref 70–99)
Potassium: 4.4 mmol/L (ref 3.5–5.2)
Sodium: 139 mmol/L (ref 134–144)
Total Protein: 6.7 g/dL (ref 6.0–8.5)
eGFR: 89 mL/min/{1.73_m2} (ref >59)

## 2022-06-19 LAB — LIPID PANEL
Chol/HDL Ratio: 4.6 ratio — ABNORMAL HIGH (ref 0.0–4.4)
Cholesterol, Total: 217 mg/dL — ABNORMAL HIGH (ref 100–199)
HDL: 47 mg/dL (ref >39)
LDL Chol Calc (NIH): 121 mg/dL — ABNORMAL HIGH (ref 0–99)
Triglycerides: 279 mg/dL — ABNORMAL HIGH (ref 0–149)
VLDL Cholesterol Cal: 49 mg/dL — ABNORMAL HIGH (ref 5–40)

## 2022-06-22 ENCOUNTER — Other Ambulatory Visit: Payer: Self-pay | Admitting: Family Medicine

## 2022-06-22 MED ORDER — ROSUVASTATIN CALCIUM 10 MG PO TABS: 10.0000 mg | ORAL_TABLET | Freq: Every day | ORAL | 3 refills | Status: DC

## 2022-06-24 DIAGNOSIS — N6012 Diffuse cystic mastopathy of left breast: Secondary | ICD-10-CM | POA: Diagnosis not present

## 2022-12-08 ENCOUNTER — Other Ambulatory Visit: Payer: Self-pay | Admitting: Family Medicine

## 2022-12-14 DIAGNOSIS — Z1231 Encounter for screening mammogram for malignant neoplasm of breast: Secondary | ICD-10-CM | POA: Diagnosis not present

## 2022-12-14 DIAGNOSIS — Z6830 Body mass index (BMI) 30.0-30.9, adult: Secondary | ICD-10-CM | POA: Diagnosis not present

## 2022-12-14 DIAGNOSIS — Z01419 Encounter for gynecological examination (general) (routine) without abnormal findings: Secondary | ICD-10-CM | POA: Diagnosis not present

## 2022-12-14 DIAGNOSIS — F411 Generalized anxiety disorder: Secondary | ICD-10-CM | POA: Diagnosis not present

## 2022-12-14 DIAGNOSIS — Z1389 Encounter for screening for other disorder: Secondary | ICD-10-CM | POA: Diagnosis not present

## 2022-12-15 DIAGNOSIS — D485 Neoplasm of uncertain behavior of skin: Secondary | ICD-10-CM | POA: Diagnosis not present

## 2022-12-15 DIAGNOSIS — L439 Lichen planus, unspecified: Secondary | ICD-10-CM | POA: Diagnosis not present

## 2022-12-15 DIAGNOSIS — L821 Other seborrheic keratosis: Secondary | ICD-10-CM | POA: Diagnosis not present

## 2022-12-18 ENCOUNTER — Ambulatory Visit: Payer: BC Managed Care – PPO | Admitting: Family Medicine

## 2022-12-18 VITALS — BP 146/92 | HR 82 | Temp 98.1°F | Ht 60.0 in | Wt 182.6 lb

## 2022-12-18 DIAGNOSIS — Z720 Tobacco use: Secondary | ICD-10-CM

## 2022-12-18 DIAGNOSIS — E782 Mixed hyperlipidemia: Secondary | ICD-10-CM

## 2022-12-18 DIAGNOSIS — K219 Gastro-esophageal reflux disease without esophagitis: Secondary | ICD-10-CM | POA: Diagnosis not present

## 2022-12-18 DIAGNOSIS — Z130 Encounter for screening for diseases of the blood and blood-forming organs and certain disorders involving the immune mechanism: Secondary | ICD-10-CM

## 2022-12-18 DIAGNOSIS — I10 Essential (primary) hypertension: Secondary | ICD-10-CM | POA: Diagnosis not present

## 2022-12-18 MED ORDER — VARENICLINE TARTRATE (STARTER) 0.5 MG X 11 & 1 MG X 42 PO TBPK: ORAL_TABLET | ORAL | 0 refills | Status: DC

## 2022-12-18 MED ORDER — VARENICLINE TARTRATE 1 MG PO TABS: 1.0000 mg | ORAL_TABLET | Freq: Two times a day (BID) | ORAL | 1 refills | Status: DC

## 2022-12-18 NOTE — Patient Instructions (Addendum)
Labs ordered.  Chantix as prescribed.  Follow up in 6 months.   Take care  Dr. Adriana Simas

## 2022-12-20 DIAGNOSIS — Z720 Tobacco use: Secondary | ICD-10-CM | POA: Insufficient documentation

## 2022-12-20 NOTE — Assessment & Plan Note (Signed)
Lipid panel to assess control on Crestor.  Continue.

## 2022-12-20 NOTE — Progress Notes (Signed)
Subjective:  Patient ID: Krista Davis, female    DOB: 1966-01-08  Age: 57 y.o. MRN: [id number]  CC: Chief Complaint  Patient presents with   Hypertension   Follow-up    HPI:  57 year old female with hypertension, GERD, hyperlipidemia presents for follow-up.  Patient continues to smoke.  We will discuss smoking cessation today.  Patient states that her blood pressure readings are normally well-controlled.  BP mildly elevated here today.  She is on amlodipine and olmesartan and tolerating.  GERD stable on Protonix.  Needs reassessment of lipids.  Compliant with Crestor.   Patient Active Problem List   Diagnosis Date Noted   Tobacco abuse 12/20/2022   Mixed hyperlipidemia 12/15/2021   Essential hypertension 12/15/2021   GERD (gastroesophageal reflux disease) 12/15/2021   Chronic sinusitis 01/06/2021   Hyperplastic colon polyp 04/19/2020    Social Hx   Social History   Socioeconomic History   Marital status: Legally Separated    Spouse name: Not on file   Number of children: Not on file   Years of education: Not on file   Highest education level: Not on file  Occupational History   Not on file  Tobacco Use   Smoking status: Every Day    Packs/day: .5    Types: Cigarettes   Smokeless tobacco: Never  Vaping Use   Vaping Use: Never used  Substance and Sexual Activity   Alcohol use: Yes    Comment: occ   Drug use: No   Sexual activity: Not on file  Other Topics Concern   Not on file  Social History Narrative   Not on file   Social Determinants of Health   Financial Resource Strain: Not on file  Food Insecurity: Not on file  Transportation Needs: Not on file  Physical Activity: Not on file  Stress: Not on file  Social Connections: Not on file    Review of Systems  Constitutional: Negative.   Respiratory: Negative.    Cardiovascular: Negative.    Objective:  BP (!) 146/92   Pulse 82   Temp 98.1 F (36.7 C)   Ht 5' (1.524 m)   Wt 182 lb 9.6  oz (82.8 kg)   SpO2 99%   BMI 35.66 kg/m      12/18/2022   11:08 AM 12/18/2022   10:31 AM 06/18/2022    3:27 PM  BP/Weight  Systolic BP 146 146 134  Diastolic BP 92 94 80  Wt. (Lbs)  182.6 172.2  BMI  35.66 kg/m2 33.63 kg/m2    Physical Exam Vitals and nursing note reviewed.  Constitutional:      General: She is not in acute distress.    Appearance: Normal appearance.  HENT:     Head: Normocephalic and atraumatic.  Eyes:     General:        Right eye: No discharge.        Left eye: No discharge.     Conjunctiva/sclera: Conjunctivae normal.  Cardiovascular:     Rate and Rhythm: Normal rate and regular rhythm.  Pulmonary:     Effort: Pulmonary effort is normal.     Breath sounds: Normal breath sounds. No wheezing, rhonchi or rales.  Neurological:     Mental Status: She is alert.  Psychiatric:        Mood and Affect: Mood normal.        Behavior: Behavior normal.     Lab Results  Component Value Date   WBC 5.6 01/11/2020  HGB 15.6 01/11/2020   HCT 45.4 01/11/2020   PLT 274 01/11/2020   GLUCOSE 105 (H) 06/18/2022   CHOL 217 (H) 06/18/2022   TRIG 279 (H) 06/18/2022   HDL 47 06/18/2022   LDLCALC 121 (H) 06/18/2022   ALT 23 06/18/2022   AST 19 06/18/2022   NA 139 06/18/2022   K 4.4 06/18/2022   CL 105 06/18/2022   CREATININE 0.78 06/18/2022   BUN 13 06/18/2022   CO2 20 06/18/2022   TSH 2.200 01/11/2020     Assessment & Plan:   Problem List Items Addressed This Visit       Cardiovascular and Mediastinum   Essential hypertension - Primary    Stable on readings.  Continue to monitor.  Continue amlodipine and olmesartan      Relevant Orders   CMP14+EGFR   Microalbumin / creatinine urine ratio     Digestive   GERD (gastroesophageal reflux disease)    Stable on Protonix.        Other   Tobacco abuse    After discussion with the patient, starting Chantix.      Mixed hyperlipidemia    Lipid panel to assess control on Crestor.  Continue.       Relevant Orders   Lipid panel   Other Visit Diagnoses     Screening for deficiency anemia       Relevant Orders   CBC       Meds ordered this encounter  Medications   DISCONTD: Varenicline Tartrate, Starter, (CHANTIX STARTING MONTH PAK) 0.5 MG X 11 & 1 MG X 42 TBPK    Sig: Days 1 to 3: 0.5 mg once daily. Days 4 to 7: 0.5 mg twice daily. Maintenance (day 8 and later): 1 mg twice daily.    Dispense:  53 each    Refill:  0   DISCONTD: varenicline (CHANTIX CONTINUING MONTH PAK) 1 MG tablet    Sig: Take 1 tablet (1 mg total) by mouth 2 (two) times daily.    Dispense:  120 tablet    Refill:  1   varenicline (CHANTIX CONTINUING MONTH PAK) 1 MG tablet    Sig: Take 1 tablet (1 mg total) by mouth 2 (two) times daily.    Dispense:  120 tablet    Refill:  1   Varenicline Tartrate, Starter, (CHANTIX STARTING MONTH PAK) 0.5 MG X 11 & 1 MG X 42 TBPK    Sig: Days 1 to 3: 0.5 mg once daily. Days 4 to 7: 0.5 mg twice daily. Maintenance (day 8 and later): 1 mg twice daily.    Dispense:  53 each    Refill:  0    Follow-up:  6 months.  Everlene Other DO [facility]

## 2022-12-20 NOTE — Assessment & Plan Note (Signed)
Stable on Protonix.  

## 2022-12-20 NOTE — Assessment & Plan Note (Signed)
Stable on readings.  Continue to monitor.  Continue amlodipine and olmesartan

## 2022-12-20 NOTE — Assessment & Plan Note (Signed)
After discussion with the patient, starting Chantix.

## 2022-12-21 ENCOUNTER — Other Ambulatory Visit: Payer: Self-pay | Admitting: *Deleted

## 2022-12-21 MED ORDER — ROSUVASTATIN CALCIUM 10 MG PO TABS: 10.0000 mg | ORAL_TABLET | Freq: Every day | ORAL | 1 refills | Status: DC

## 2022-12-21 MED ORDER — AMLODIPINE-OLMESARTAN 5-20 MG PO TABS: 1.0000 | ORAL_TABLET | Freq: Every day | ORAL | 1 refills | Status: DC

## 2022-12-21 MED ORDER — PANTOPRAZOLE SODIUM 40 MG PO TBEC: DELAYED_RELEASE_TABLET | ORAL | 1 refills | Status: DC

## 2023-02-12 ENCOUNTER — Ambulatory Visit: Payer: BC Managed Care – PPO | Admitting: Family Medicine

## 2023-02-12 ENCOUNTER — Ambulatory Visit (HOSPITAL_COMMUNITY): Admission: RE | Admit: 2023-02-12 | Payer: BC Managed Care – PPO | Source: Ambulatory Visit

## 2023-02-12 VITALS — BP 136/83 | HR 80 | Temp 98.1°F | Ht 60.0 in | Wt 183.8 lb

## 2023-02-12 DIAGNOSIS — I10 Essential (primary) hypertension: Secondary | ICD-10-CM | POA: Diagnosis not present

## 2023-02-12 DIAGNOSIS — E782 Mixed hyperlipidemia: Secondary | ICD-10-CM

## 2023-02-12 DIAGNOSIS — M5412 Radiculopathy, cervical region: Secondary | ICD-10-CM | POA: Insufficient documentation

## 2023-02-12 DIAGNOSIS — M8580 Other specified disorders of bone density and structure, unspecified site: Secondary | ICD-10-CM | POA: Diagnosis not present

## 2023-02-12 DIAGNOSIS — M50123 Cervical disc disorder at C6-C7 level with radiculopathy: Secondary | ICD-10-CM | POA: Diagnosis not present

## 2023-02-12 DIAGNOSIS — Z130 Encounter for screening for diseases of the blood and blood-forming organs and certain disorders involving the immune mechanism: Secondary | ICD-10-CM | POA: Diagnosis not present

## 2023-02-12 MED ORDER — PREDNISONE 10 MG PO TABS: ORAL_TABLET | ORAL | 0 refills | Status: DC

## 2023-02-12 MED ORDER — AMLODIPINE-OLMESARTAN 10-20 MG PO TABS: 1.0000 | ORAL_TABLET | Freq: Every day | ORAL | 1 refills | Status: DC

## 2023-02-12 MED ORDER — TRAMADOL HCL 50 MG PO TABS: 50.0000 mg | ORAL_TABLET | Freq: Three times a day (TID) | ORAL | 0 refills | Status: AC | PRN

## 2023-02-12 NOTE — Patient Instructions (Signed)
Medications sent.  Xray at the hospital.  Follow up in 3 months.

## 2023-02-13 LAB — CBC
RBC: 4.45 x10E6/uL (ref 3.77–5.28)
RDW: 12.3 % (ref 11.7–15.4)
WBC: 7.4 10*3/uL (ref 3.4–10.8)

## 2023-02-13 LAB — CMP14+EGFR: Alkaline Phosphatase: 87 IU/L (ref 44–121)

## 2023-02-15 ENCOUNTER — Encounter: Payer: Self-pay | Admitting: *Deleted

## 2023-02-15 DIAGNOSIS — M5412 Radiculopathy, cervical region: Secondary | ICD-10-CM | POA: Insufficient documentation

## 2023-02-15 NOTE — Assessment & Plan Note (Signed)
LDL at goal on Crestor.  Continue.

## 2023-02-15 NOTE — Assessment & Plan Note (Signed)
Patient reports that her blood pressure readings have been on the high side at home. She has some room for improvement.  Increasing amlodipine/olmesartan.

## 2023-02-15 NOTE — Assessment & Plan Note (Signed)
X-ray for further evaluation.  Placing on prednisone.  Tramadol as needed.

## 2023-02-15 NOTE — Progress Notes (Signed)
Subjective:  Patient ID: Krista Davis, female    DOB: 1965/12/23  Age: 57 y.o. MRN: [id number]  CC: Chief Complaint  Patient presents with   Neck Pain    Pt complains of an ache in her right side of the neck that shoots down her right arm as well    HPI:  57 year old female presents for evaluation of the above.  Patient reports that she has had right-sided neck pain over the past 6 weeks.  Over the past 2 weeks she has had numbness and tingling in her fingers.  She is unable to differentiate which digits have had numbness and tingling.  No recent fall, trauma, injury.  No relieving factors.  BP stable here today.  She is on amlodipine/olmesartan.  Patient reports that she has had some high readings at home.  She would like to discuss increase in the medication.  LDL at goal on Crestor.    Patient Active Problem List   Diagnosis Date Noted   Cervical radiculopathy 02/15/2023   Tobacco abuse 12/20/2022   Mixed hyperlipidemia 12/15/2021   Essential hypertension 12/15/2021   GERD (gastroesophageal reflux disease) 12/15/2021   Chronic sinusitis 01/06/2021   Hyperplastic colon polyp 04/19/2020    Social Hx   Social History   Socioeconomic History   Marital status: Legally Separated    Spouse name: Not on file   Number of children: Not on file   Years of education: Not on file   Highest education level: Not on file  Occupational History   Not on file  Tobacco Use   Smoking status: Every Day    Current packs/day: 0.50    Types: Cigarettes   Smokeless tobacco: Never  Vaping Use   Vaping status: Never Used  Substance and Sexual Activity   Alcohol use: Yes    Comment: occ   Drug use: No   Sexual activity: Not on file  Other Topics Concern   Not on file  Social History Narrative   Not on file   Social Determinants of Health   Financial Resource Strain: Not on file  Food Insecurity: Not on file  Transportation Needs: Not on file  Physical Activity: Not on file   Stress: Not on file  Social Connections: Unknown (12/03/2021)   Received from [facility], [facility]   Social Network    Social Network: Not on file    Review of Systems Per HPI  Objective:  BP 136/83   Pulse 80   Temp 98.1 F (36.7 C)   Ht 5' (1.524 m)   Wt 183 lb 12.8 oz (83.4 kg)   SpO2 99%   BMI 35.90 kg/m      02/12/2023    2:36 PM 12/18/2022   11:08 AM 12/18/2022   10:31 AM  BP/Weight  Systolic BP 136 146 146  Diastolic BP 83 92 94  Wt. (Lbs) 183.8  182.6  BMI 35.9 kg/m2  35.66 kg/m2    Physical Exam Vitals and nursing note reviewed.  Constitutional:      General: She is not in acute distress.    Appearance: Normal appearance.  HENT:     Head: Normocephalic and atraumatic.  Cardiovascular:     Rate and Rhythm: Normal rate and regular rhythm.  Pulmonary:     Effort: Pulmonary effort is normal.     Breath sounds: Normal breath sounds. No wheezing, rhonchi or rales.  Neurological:     Mental Status: She is alert.  Comments: Normal handgrip strength.  Psychiatric:        Mood and Affect: Mood normal.        Behavior: Behavior normal.     Lab Results  Component Value Date   WBC 7.4 02/12/2023   HGB 14.0 02/12/2023   HCT 41.4 02/12/2023   PLT 283 02/12/2023   GLUCOSE 100 (H) 02/12/2023   CHOL 171 02/12/2023   TRIG 305 (H) 02/12/2023   HDL 54 02/12/2023   LDLCALC 69 02/12/2023   ALT 17 02/12/2023   AST 16 02/12/2023   NA 139 02/12/2023   K 4.8 02/12/2023   CL 102 02/12/2023   CREATININE 0.65 02/12/2023   BUN 13 02/12/2023   CO2 22 02/12/2023   TSH 2.200 01/11/2020     Assessment & Plan:   Problem List Items Addressed This Visit       Cardiovascular and Mediastinum   Essential hypertension    Patient reports that her blood pressure readings have been on the high side at home. She has some room for improvement.  Increasing amlodipine/olmesartan.      Relevant Medications   amlodipine-olmesartan (AZOR) 10-20 MG tablet      Nervous and Auditory   Cervical radiculopathy - Primary    X-ray for further evaluation.  Placing on prednisone.  Tramadol as needed.      Relevant Orders   DG Cervical Spine Complete (Completed)     Other   Mixed hyperlipidemia    LDL at goal on Crestor.  Continue.      Relevant Medications   amlodipine-olmesartan (AZOR) 10-20 MG tablet    Meds ordered this encounter  Medications   predniSONE (DELTASONE) 10 MG tablet    Sig: 50 mg daily x 2 days, then 40 mg daily x 2 days, then 30 mg daily x 2 days, then 20 mg daily x 2 days, then 10 mg daily x 2 days.    Dispense:  30 tablet    Refill:  0   traMADol (ULTRAM) 50 MG tablet    Sig: Take 1-2 tablets (50-100 mg total) by mouth every 8 (eight) hours as needed for up to 5 days for moderate pain or severe pain.    Dispense:  15 tablet    Refill:  0   amlodipine-olmesartan (AZOR) 10-20 MG tablet    Sig: Take 1 tablet by mouth daily.    Dispense:  90 tablet    Refill:  1    Follow-up:  Return in about 3 months (around 05/15/2023).  Everlene Other DO [facility]

## 2023-02-17 ENCOUNTER — Telehealth: Payer: Self-pay

## 2023-02-17 NOTE — Telephone Encounter (Signed)
Pt is calling she needs her amlodipine-olmesartan (AZOR) 10-20 MG tablet sent to [facility] [address] [place] [phone]

## 2023-02-18 ENCOUNTER — Other Ambulatory Visit: Payer: Self-pay | Admitting: Nurse Practitioner

## 2023-02-18 MED ORDER — AMLODIPINE-OLMESARTAN 10-20 MG PO TABS: 1.0000 | ORAL_TABLET | Freq: Every day | ORAL | 1 refills | Status: DC

## 2023-02-23 DIAGNOSIS — M25511 Pain in right shoulder: Secondary | ICD-10-CM | POA: Diagnosis not present

## 2023-02-23 DIAGNOSIS — M9902 Segmental and somatic dysfunction of thoracic region: Secondary | ICD-10-CM | POA: Diagnosis not present

## 2023-02-23 DIAGNOSIS — M7918 Myalgia, other site: Secondary | ICD-10-CM | POA: Diagnosis not present

## 2023-02-23 DIAGNOSIS — M5412 Radiculopathy, cervical region: Secondary | ICD-10-CM | POA: Diagnosis not present

## 2023-02-23 DIAGNOSIS — M9901 Segmental and somatic dysfunction of cervical region: Secondary | ICD-10-CM | POA: Diagnosis not present

## 2023-02-24 ENCOUNTER — Other Ambulatory Visit: Payer: Self-pay | Admitting: Family Medicine

## 2023-03-02 DIAGNOSIS — M7918 Myalgia, other site: Secondary | ICD-10-CM | POA: Diagnosis not present

## 2023-03-02 DIAGNOSIS — M5412 Radiculopathy, cervical region: Secondary | ICD-10-CM | POA: Diagnosis not present

## 2023-03-02 DIAGNOSIS — M25511 Pain in right shoulder: Secondary | ICD-10-CM | POA: Diagnosis not present

## 2023-03-02 DIAGNOSIS — M9902 Segmental and somatic dysfunction of thoracic region: Secondary | ICD-10-CM | POA: Diagnosis not present

## 2023-03-02 DIAGNOSIS — M9901 Segmental and somatic dysfunction of cervical region: Secondary | ICD-10-CM | POA: Diagnosis not present

## 2023-03-10 DIAGNOSIS — M9902 Segmental and somatic dysfunction of thoracic region: Secondary | ICD-10-CM | POA: Diagnosis not present

## 2023-03-10 DIAGNOSIS — M5412 Radiculopathy, cervical region: Secondary | ICD-10-CM | POA: Diagnosis not present

## 2023-03-10 DIAGNOSIS — M7918 Myalgia, other site: Secondary | ICD-10-CM | POA: Diagnosis not present

## 2023-03-10 DIAGNOSIS — M9901 Segmental and somatic dysfunction of cervical region: Secondary | ICD-10-CM | POA: Diagnosis not present

## 2023-03-10 DIAGNOSIS — M25511 Pain in right shoulder: Secondary | ICD-10-CM | POA: Diagnosis not present

## 2023-03-16 DIAGNOSIS — M9901 Segmental and somatic dysfunction of cervical region: Secondary | ICD-10-CM | POA: Diagnosis not present

## 2023-03-16 DIAGNOSIS — M25511 Pain in right shoulder: Secondary | ICD-10-CM | POA: Diagnosis not present

## 2023-03-16 DIAGNOSIS — M9902 Segmental and somatic dysfunction of thoracic region: Secondary | ICD-10-CM | POA: Diagnosis not present

## 2023-03-16 DIAGNOSIS — M5412 Radiculopathy, cervical region: Secondary | ICD-10-CM | POA: Diagnosis not present

## 2023-03-16 DIAGNOSIS — M7918 Myalgia, other site: Secondary | ICD-10-CM | POA: Diagnosis not present

## 2023-03-30 DIAGNOSIS — M5412 Radiculopathy, cervical region: Secondary | ICD-10-CM | POA: Diagnosis not present

## 2023-03-30 DIAGNOSIS — M9902 Segmental and somatic dysfunction of thoracic region: Secondary | ICD-10-CM | POA: Diagnosis not present

## 2023-03-30 DIAGNOSIS — M9901 Segmental and somatic dysfunction of cervical region: Secondary | ICD-10-CM | POA: Diagnosis not present

## 2023-03-30 DIAGNOSIS — M25511 Pain in right shoulder: Secondary | ICD-10-CM | POA: Diagnosis not present

## 2023-03-30 DIAGNOSIS — M7918 Myalgia, other site: Secondary | ICD-10-CM | POA: Diagnosis not present

## 2023-04-06 ENCOUNTER — Ambulatory Visit: Payer: BC Managed Care – PPO | Admitting: Family Medicine

## 2023-04-06 VITALS — Ht 60.0 in

## 2023-04-06 DIAGNOSIS — U071 COVID-19: Secondary | ICD-10-CM | POA: Diagnosis not present

## 2023-04-06 MED ORDER — NIRMATRELVIR/RITONAVIR (PAXLOVID)TABLET: 3.0000 | ORAL_TABLET | Freq: Two times a day (BID) | ORAL | 0 refills | Status: AC

## 2023-04-06 MED ORDER — PROMETHAZINE-DM 6.25-15 MG/5ML PO SYRP: 5.0000 mL | ORAL_SOLUTION | Freq: Four times a day (QID) | ORAL | 0 refills | Status: DC | PRN

## 2023-04-06 NOTE — Progress Notes (Signed)
Subjective:  Patient ID: Krista Davis, female    DOB: 11-Feb-1966  Age: 57 y.o. MRN: [id number]  CC: Respiratory symptoms, faintly positive COVID test at home.   HPI:  57 year old female presents for evaluation of the above.  Symptoms started on Sunday.  She reports congestion, cough, headache, and sinus pressure.  Also reports pressure in the ears.  No documented fever.  Mild improvement with over-the-counter ibuprofen.  There has been some individuals at work with COVID-19.  She states that she took a home COVID test yesterday and it was faintly positive.  Patient Active Problem List   Diagnosis Date Noted   COVID 04/06/2023   Cervical radiculopathy 02/15/2023   Tobacco abuse 12/20/2022   Mixed hyperlipidemia 12/15/2021   Essential hypertension 12/15/2021   GERD (gastroesophageal reflux disease) 12/15/2021   Chronic sinusitis 01/06/2021   Hyperplastic colon polyp 04/19/2020    Social Hx   Social History   Socioeconomic History   Marital status: Legally Separated    Spouse name: Not on file   Number of children: Not on file   Years of education: Not on file   Highest education level: Not on file  Occupational History   Not on file  Tobacco Use   Smoking status: Every Day    Current packs/day: 0.50    Types: Cigarettes   Smokeless tobacco: Never  Vaping Use   Vaping status: Never Used  Substance and Sexual Activity   Alcohol use: Yes    Comment: occ   Drug use: No   Sexual activity: Not on file  Other Topics Concern   Not on file  Social History Narrative   Not on file   Social Determinants of Health   Financial Resource Strain: Not on file  Food Insecurity: Not on file  Transportation Needs: Not on file  Physical Activity: Not on file  Stress: Not on file  Social Connections: Unknown (12/03/2021)   Received from [facility], [facility]   Social Network    Social Network: Not on file    Review of Systems Per HPI  Objective:  Ht 5' (1.524 m)    BMI 35.90 kg/m      02/12/2023    2:36 PM 12/18/2022   11:08 AM 12/18/2022   10:31 AM  BP/Weight  Systolic BP 136 146 146  Diastolic BP 83 92 94  Wt. (Lbs) 183.8  182.6  BMI 35.9 kg/m2  35.66 kg/m2    Physical Exam Vitals and nursing note reviewed.  Constitutional:      Appearance: Normal appearance.  HENT:     Head: Normocephalic and atraumatic.     Right Ear: Tympanic membrane normal.     Left Ear: Tympanic membrane normal.  Eyes:     General:        Right eye: No discharge.        Left eye: No discharge.     Conjunctiva/sclera: Conjunctivae normal.  Cardiovascular:     Rate and Rhythm: Normal rate and regular rhythm.  Pulmonary:     Effort: Pulmonary effort is normal.     Breath sounds: Normal breath sounds. No wheezing or rales.  Neurological:     Mental Status: She is alert.     Lab Results  Component Value Date   WBC 7.4 02/12/2023   HGB 14.0 02/12/2023   HCT 41.4 02/12/2023   PLT 283 02/12/2023   GLUCOSE 100 (H) 02/12/2023   CHOL 171 02/12/2023   TRIG 305 (H) 02/12/2023  HDL 54 02/12/2023   LDLCALC 69 02/12/2023   ALT 17 02/12/2023   AST 16 02/12/2023   NA 139 02/12/2023   K 4.8 02/12/2023   CL 102 02/12/2023   CREATININE 0.65 02/12/2023   BUN 13 02/12/2023   CO2 22 02/12/2023   TSH 2.200 01/11/2020     Assessment & Plan:   Problem List Items Addressed This Visit       Other   COVID - Primary    COVID-positive via home test.  Patient is within treatment window.  Treating with Paxlovid.  Promethazine DM for cough.      Relevant Medications   nirmatrelvir/ritonavir (PAXLOVID) 20 x 150 MG & 10 x 100MG  TABS    Meds ordered this encounter  Medications   nirmatrelvir/ritonavir (PAXLOVID) 20 x 150 MG & 10 x 100MG  TABS    Sig: Take 3 tablets by mouth 2 (two) times daily for 5 days. Take nirmatrelvir 150 mg two tablets twice daily for 5 days and ritonavir 100 mg one tablet twice daily for 5 days. Patient GFR is >100.    Dispense:  30 tablet     Refill:  0   promethazine-dextromethorphan (PROMETHAZINE-DM) 6.25-15 MG/5ML syrup    Sig: Take 5 mLs by mouth 4 (four) times daily as needed for cough.    Dispense:  118 mL    Refill:  0    Follow-up:  Return if symptoms worsen or fail to improve.  Everlene Other DO [facility]

## 2023-04-06 NOTE — Assessment & Plan Note (Signed)
COVID-positive via home test.  Patient is within treatment window.  Treating with Paxlovid.  Promethazine DM for cough.

## 2023-05-12 ENCOUNTER — Other Ambulatory Visit: Payer: Self-pay | Admitting: Family Medicine

## 2023-05-17 ENCOUNTER — Ambulatory Visit: Payer: BC Managed Care – PPO | Admitting: Family Medicine

## 2023-05-17 ENCOUNTER — Encounter: Payer: Self-pay | Admitting: Family Medicine

## 2023-05-17 VITALS — BP 106/70 | HR 81 | Temp 98.5°F | Ht 60.0 in | Wt 182.0 lb

## 2023-05-17 DIAGNOSIS — E782 Mixed hyperlipidemia: Secondary | ICD-10-CM | POA: Diagnosis not present

## 2023-05-17 DIAGNOSIS — I10 Essential (primary) hypertension: Secondary | ICD-10-CM | POA: Diagnosis not present

## 2023-05-17 DIAGNOSIS — Z720 Tobacco use: Secondary | ICD-10-CM

## 2023-05-17 DIAGNOSIS — K219 Gastro-esophageal reflux disease without esophagitis: Secondary | ICD-10-CM | POA: Diagnosis not present

## 2023-05-17 MED ORDER — PANTOPRAZOLE SODIUM 40 MG PO TBEC: 40.0000 mg | DELAYED_RELEASE_TABLET | Freq: Every day | ORAL | 3 refills | Status: DC | PRN

## 2023-05-17 MED ORDER — ROSUVASTATIN CALCIUM 10 MG PO TABS: 10.0000 mg | ORAL_TABLET | Freq: Every day | ORAL | 3 refills | Status: DC

## 2023-05-17 MED ORDER — AMLODIPINE-OLMESARTAN 10-20 MG PO TABS: 1.0000 | ORAL_TABLET | Freq: Every day | ORAL | 3 refills | Status: DC

## 2023-05-17 NOTE — Progress Notes (Signed)
Subjective:  Patient ID: Krista Davis, female    DOB: [birth year]  Age: 57 y.o. MRN: [id number]  CC:   Chief Complaint  Patient presents with   Hypertension   Hyperlipidemia   Urticaria    Recent hives started 2 weeks ago that began 1 week after 1st shingles vaccine    HPI:  57 year old female with the below mentioned medical problems presents for follow up.  HTN is well controlled on Azor.   LDL at goal on Crestor.  Still smoking. Will discuss cessation today.   Reports that she needs medication refills.   Recently got first dose of Shingrix and subsequently developed hives and associated itching.   Patient Active Problem List   Diagnosis Date Noted   Cervical radiculopathy 02/15/2023   Tobacco abuse 12/20/2022   Mixed hyperlipidemia 12/15/2021   Essential hypertension 12/15/2021   GERD (gastroesophageal reflux disease) 12/15/2021   Chronic sinusitis 01/06/2021   Hyperplastic colon polyp 04/19/2020    Social Hx   Social History   Socioeconomic History   Marital status: Legally Separated    Spouse name: Not on file   Number of children: Not on file   Years of education: Not on file   Highest education level: Not on file  Occupational History   Not on file  Tobacco Use   Smoking status: Every Day    Current packs/day: 0.50    Types: Cigarettes   Smokeless tobacco: Never  Vaping Use   Vaping status: Never Used  Substance and Sexual Activity   Alcohol use: Yes    Comment: occ   Drug use: No   Sexual activity: Not on file  Other Topics Concern   Not on file  Social History Narrative   Not on file   Social Determinants of Health   Financial Resource Strain: Not on file  Food Insecurity: Not on file  Transportation Needs: Not on file  Physical Activity: Not on file  Stress: Not on file  Social Connections: Unknown (12/03/2021)   Received from [facility]   Social Network    Social Network: Not on file    Review of Systems Per  HPI  Objective:  BP 106/70   Pulse 81   Temp 98.5 F (36.9 C)   Ht 5' (1.524 m)   Wt 182 lb (82.6 kg)   SpO2 97%   BMI 35.54 kg/m      05/17/2023    2:03 PM 02/12/2023    2:36 PM 12/18/2022   11:08 AM  BP/Weight  Systolic BP 106 136 146  Diastolic BP 70 83 92  Wt. (Lbs) 182 183.8   BMI 35.54 kg/m2 35.9 kg/m2     Physical Exam Vitals and nursing note reviewed.  Constitutional:      General: She is not in acute distress.    Appearance: Normal appearance.  HENT:     Head: Normocephalic and atraumatic.  Eyes:     General:        Right eye: No discharge.        Left eye: No discharge.     Conjunctiva/sclera: Conjunctivae normal.  Cardiovascular:     Rate and Rhythm: Normal rate and regular rhythm.  Pulmonary:     Effort: Pulmonary effort is normal.     Breath sounds: Normal breath sounds. No wheezing, rhonchi or rales.  Skin:    Findings: No rash.  Neurological:     Mental Status: She is alert.  Psychiatric:  Mood and Affect: Mood normal.        Behavior: Behavior normal.     Lab Results  Component Value Date   WBC 7.4 02/12/2023   HGB 14.0 02/12/2023   HCT 41.4 02/12/2023   PLT 283 02/12/2023   GLUCOSE 100 (H) 02/12/2023   CHOL 171 02/12/2023   TRIG 305 (H) 02/12/2023   HDL 54 02/12/2023   LDLCALC 69 02/12/2023   ALT 17 02/12/2023   AST 16 02/12/2023   NA 139 02/12/2023   K 4.8 02/12/2023   CL 102 02/12/2023   CREATININE 0.65 02/12/2023   BUN 13 02/12/2023   CO2 22 02/12/2023   TSH 2.200 01/11/2020     Assessment & Plan:   Problem List Items Addressed This Visit       Cardiovascular and Mediastinum   Essential hypertension - Primary    Stable. Continue Azor.      Relevant Medications   amlodipine-olmesartan (AZOR) 10-20 MG tablet   rosuvastatin (CRESTOR) 10 MG tablet     Digestive   GERD (gastroesophageal reflux disease)    Stable. Protonix as needed.       Relevant Medications   pantoprazole (PROTONIX) 40 MG tablet      Other   Tobacco abuse    Discussed cessation today. She is not yet ready/not committed to quitting.      Mixed hyperlipidemia    LDL at goal. Continue Crestor.       Relevant Medications   amlodipine-olmesartan (AZOR) 10-20 MG tablet   rosuvastatin (CRESTOR) 10 MG tablet    Meds ordered this encounter  Medications   amlodipine-olmesartan (AZOR) 10-20 MG tablet    Sig: Take 1 tablet by mouth daily.    Dispense:  90 tablet    Refill:  3   rosuvastatin (CRESTOR) 10 MG tablet    Sig: Take 1 tablet (10 mg total) by mouth daily.    Dispense:  90 tablet    Refill:  3   pantoprazole (PROTONIX) 40 MG tablet    Sig: Take 1 tablet (40 mg total) by mouth daily as needed (GERD).    Dispense:  90 tablet    Refill:  3    Follow-up:  6 months  Krista Davis Krista Simas DO [facility]

## 2023-05-17 NOTE — Assessment & Plan Note (Addendum)
Stable. Protonix as needed.

## 2023-05-17 NOTE — Patient Instructions (Signed)
Continue your medications.  Follow up in 6 months.  Take care  Dr. Avelino Herren  

## 2023-05-17 NOTE — Assessment & Plan Note (Signed)
Stable. Continue Azor.

## 2023-05-17 NOTE — Assessment & Plan Note (Signed)
LDL at goal.  Continue Crestor. 

## 2023-05-17 NOTE — Assessment & Plan Note (Signed)
Discussed cessation today. She is not yet ready/not committed to quitting.

## 2023-06-21 ENCOUNTER — Other Ambulatory Visit: Payer: Self-pay | Admitting: Family Medicine

## 2023-06-21 ENCOUNTER — Telehealth: Payer: Self-pay | Admitting: *Deleted

## 2023-06-21 MED ORDER — PROMETHAZINE-DM 6.25-15 MG/5ML PO SYRP: 5.0000 mL | ORAL_SOLUTION | Freq: Four times a day (QID) | ORAL | 0 refills | Status: DC | PRN

## 2023-06-21 NOTE — Telephone Encounter (Signed)
Patient notified

## 2023-06-21 NOTE — Telephone Encounter (Signed)
Copied from CRM [id number]. Topic: Clinical - Medication Question >> Jun 21, 2023  3:14 PM Antony Haste wrote: Reason for CRM: PT would like to determine if PCP can send refill to [facility], for unspecified prescription she states she previously took for her cough. PT states she was supposed to receive a callback from PCP or nurses to schedule an acute visit for her cough but has not heard anything back yet.  Callback #: [phone]

## 2023-06-21 NOTE — Telephone Encounter (Signed)
Tommie Sams, DO   Rx sent for cough.

## 2023-08-19 ENCOUNTER — Other Ambulatory Visit: Payer: Self-pay | Admitting: Nurse Practitioner

## 2023-08-23 ENCOUNTER — Other Ambulatory Visit: Payer: Self-pay | Admitting: Family Medicine

## 2023-09-02 ENCOUNTER — Ambulatory Visit: Payer: Self-pay | Admitting: Family Medicine

## 2023-09-02 NOTE — Telephone Encounter (Signed)
 Copied from CRM [id number]. Topic: Clinical - Red Word Triage >> Sep 02, 2023  3:35 PM Shon Hale wrote: Red Word that prompted transfer to Nurse Triage: Cough on and off since december. Has had shortness of breath due to cough.   Chief Complaint: Cough Symptoms: Cough Frequency: Intermittent  Pertinent Negatives: Patient denies fever, shortness of breath  Disposition: [] ED /[] Urgent Care (no appt availability in office) / [x] Appointment(In office/virtual)/ []  [facility]/ [] Home Care/ [] Refused Recommended Disposition /[] [facility] [facility]/ []  Follow-up with PCP Additional Notes: Patient reports having an intermittent cough since December that worsened over the last 2 weeks. She states that she has some coughing spells that last 10 minutes and that she sometimes feels short of breath after a coughing fit that resolves shortly after. She denies any fevers. Appointment made for the patient on Monday.   Patient would like a prescription for cough syrup for the weekend if possible and would like a call back with a response to the request.    Reason for Disposition  Cough has been present for > 3 weeks  Answer Assessment - Initial Assessment Questions 1. ONSET: "When did the cough begin?"       A couple of weeks ago got worse, intermittent cough since December  2. SEVERITY: "How bad is the cough today?"      Some coughing fits last 10 minutes  3. SPUTUM: "Describe the color of your sputum" (none, dry cough; clear, white, yellow, green)     Dry cough  4. HEMOPTYSIS: "Are you coughing up any blood?" If so ask: "How much?" (flecks, streaks, tablespoons, etc.)     No 5. DIFFICULTY BREATHING: "Are you having difficulty breathing?" If Yes, ask: "How bad is it?" (e.g., mild, moderate, severe)    - MILD: No SOB at rest, mild SOB with walking, speaks normally in sentences, can lie down, no retractions, pulse < 100.    - MODERATE: SOB at rest, SOB with minimal exertion and prefers to sit,  cannot lie down flat, speaks in phrases, mild retractions, audible wheezing, pulse 100-120.    - SEVERE: Very SOB at rest, speaks in single words, struggling to breathe, sitting hunched forward, retractions, pulse > 120      Gets out of breath with coughing  6. FEVER: "Do you have a fever?" If Yes, ask: "What is your temperature, how was it measured, and when did it start?"     No 7. CARDIAC HISTORY: "Do you have any history of heart disease?" (e.g., heart attack, congestive heart failure)      No 8. LUNG HISTORY: "Do you have any history of lung disease?"  (e.g., pulmonary embolus, asthma, emphysema)     No 9. PE RISK FACTORS: "Do you have a history of blood clots?" (or: recent major surgery, recent prolonged travel, bedridden)     No 10. OTHER SYMPTOMS: "Do you have any other symptoms?" (e.g., runny nose, wheezing, chest pain)       Nasal congestion with coughing fits  Protocols used: Cough - Acute Non-Productive-A-AH

## 2023-09-06 ENCOUNTER — Ambulatory Visit (HOSPITAL_COMMUNITY)
Admission: RE | Admit: 2023-09-06 | Discharge: 2023-09-06 | Disposition: A | Discharge status: Home / Self Care | Source: Ambulatory Visit | Attending: Family Medicine | Admitting: Family Medicine

## 2023-09-06 ENCOUNTER — Encounter: Payer: Self-pay | Admitting: Family Medicine

## 2023-09-06 ENCOUNTER — Ambulatory Visit: Admitting: Family Medicine

## 2023-09-06 VITALS — BP 128/72 | HR 84 | Temp 98.1°F | Ht 60.0 in | Wt 186.0 lb

## 2023-09-06 DIAGNOSIS — J040 Acute laryngitis: Secondary | ICD-10-CM | POA: Diagnosis not present

## 2023-09-06 DIAGNOSIS — R053 Chronic cough: Secondary | ICD-10-CM | POA: Insufficient documentation

## 2023-09-06 DIAGNOSIS — R0609 Other forms of dyspnea: Secondary | ICD-10-CM

## 2023-09-06 DIAGNOSIS — R0683 Snoring: Secondary | ICD-10-CM

## 2023-09-06 DIAGNOSIS — K219 Gastro-esophageal reflux disease without esophagitis: Secondary | ICD-10-CM

## 2023-09-06 DIAGNOSIS — R059 Cough, unspecified: Secondary | ICD-10-CM | POA: Diagnosis not present

## 2023-09-06 DIAGNOSIS — Z720 Tobacco use: Secondary | ICD-10-CM | POA: Diagnosis not present

## 2023-09-06 MED ORDER — PROMETHAZINE-DM 6.25-15 MG/5ML PO SYRP: 5.0000 mL | ORAL_SOLUTION | Freq: Four times a day (QID) | ORAL | 2 refills | Status: DC | PRN

## 2023-09-06 NOTE — Progress Notes (Signed)
 Subjective:    Patient ID: Krista Davis, female    DOB: Aug 02, 1965, 58 y.o.   MRN: [id number]  Discussed the use of AI scribe software for clinical note transcription with the patient, who gave verbal consent to proceed.  History of Present Illness   The patient presents with a chronic cough and respiratory symptoms.  She has been experiencing a persistent dry, irritating cough for about six months, which initially improved after a change in blood pressure medication but then recurred. The cough originates from her throat, feeling like a 'tickle' or 'squeeze', and sometimes causes a sensation of throat constriction. She has noticed wheezing, particularly before the past week, and experiences shortness of breath with exertion, although she can walk and climb stairs without needing to stop. No postnasal drip, but she reports coughing up clear sputum and has not experienced spasm coughing in the last few days. She has a history of sinus infections but has not had recent issues with sinus drainage.  She was placed on a blood pressure medication, one of the 'preolins', and subsequently contracted a severe case of COVID-19 approximately two years ago. Since then, she has experienced the persistent cough. She is currently taking Protonix for acid reflux and has been using black seed oil, which has allowed her to stop other allergy medications. She has a history of using a 'purple pill' for acid reflux in the past.  She smokes and works at [employer], where she was recently exposed to a strong smell and powder, which exacerbated her symptoms, leading to a hoarse voice since last Wednesday. She shares a room with her daughter and has been sleeping in a recliner to avoid disturbing her daughter's sleep.  She is on Protonix for GERD and reports good control of symptoms when compliant with medication. She experiences acid reflux symptoms in both her chest and esophagus when not medicated, which could  contribute to the chronic cough.  She reports snoring and feeling fatigued upon waking, with daytime sleepiness, especially in the afternoons. These symptoms suggest possible sleep apnea.      Patient has significant daytime tiredness fatigue feeling sleepy snores at night  Review of Systems     Objective:    Physical Exam   CHEST: Lungs clear to auscultation bilaterally. CARDIOVASCULAR: Heart sounds normal.     General-in no acute distress Eyes-no discharge Lungs-respiratory rate normal, CTA CV-no murmurs,RRR Extremities skin warm dry no edema Neuro grossly normal Behavior normal, alert       Assessment & Plan:  Assessment and Plan    Chronic Cough Persistent cough for over 6 months, initially dry and irritating, now productive with clear sputum. No significant postnasal drip. History of smoking. Recent exposure to potential allergens at work. -Order chest X-ray to evaluate lung condition. -Refer to Ear, Nose, and Throat (ENT) specialist for laryngeal examination. -Order Pulmonary Function Test (PFT) to assess lung function and potential early signs of COPD.  Gastroesophageal Reflux Disease (GERD) On Protonix for acid reflux, with good control of symptoms when medication is taken consistently. -Continue Protonix as prescribed. -Advise to elevate head of bed by 6 inches to reduce nocturnal reflux.  Potential Sleep Apnea Reports of snoring, daytime fatigue, and sleepiness. -Refer for sleep apnea evaluation.  Tobacco Use Current smoker, acknowledges need to quit. -Encourage smoking cessation for overall health improvement and potential reduction in cough severity.  Follow-up -Refill cough syrup prescription. -Review results of chest X-ray, PFT, and sleep study when available. -Communicate with ENT  specialist post-consultation for further management plan.     1. Chronic cough (Primary) Patient will be seeing ENT she scheduled an appointment in approximately 10 days  with Dr. Pollyann Kennedy.  It is quite possible this is upper airway cough syndrome but could be related to GERD, related head congestion postnasal drainage, related to smoking.  No doubt she needs to have her laryngeal area looked at by ENT Will order chest x-ray - DG Chest 2 View  2. Gastroesophageal reflux disease without esophagitis Continue current PPI.  Encourage patient to put head of bed on 6 inch block  3. Laryngitis Needs ENT evaluation, voice is hoarse but she states it has been that way progressively for the past many months but seems to be getting worse.  She is a smoker she understands the need to quit  4. Tobacco abuse Counseled regarding quitting  5. Snoring Patient has daytime fatigue tiredness also feels rundown at times snores at night.  Raises into question the probability of sleep apnea - Ambulatory referral to Sleep Studies  6. DOE (dyspnea on exertion) Patient relates when she climbs up a set of steps or does anything vigorous she gets a little winded but no chest tightness pressure pain with her smoking history we will do pulmonary function test with pre and post albuterol to screen for COPD - Pulmonary function test  Follow-up sooner if any problems Follow-up with Dr. Adriana Simas Send [place] update within the next 2 to 3 weeks

## 2023-09-14 DIAGNOSIS — K219 Gastro-esophageal reflux disease without esophagitis: Secondary | ICD-10-CM | POA: Diagnosis not present

## 2023-09-14 DIAGNOSIS — F17210 Nicotine dependence, cigarettes, uncomplicated: Secondary | ICD-10-CM | POA: Diagnosis not present

## 2023-09-21 ENCOUNTER — Encounter: Payer: Self-pay | Admitting: Family Medicine

## 2023-09-23 ENCOUNTER — Encounter: Payer: Self-pay | Admitting: Neurology

## 2023-09-23 ENCOUNTER — Ambulatory Visit: Admitting: Neurology

## 2023-09-23 VITALS — BP 112/71 | HR 86 | Ht 65.5 in | Wt 189.4 lb

## 2023-09-23 DIAGNOSIS — G4719 Other hypersomnia: Secondary | ICD-10-CM | POA: Diagnosis not present

## 2023-09-23 DIAGNOSIS — E66811 Obesity, class 1: Secondary | ICD-10-CM | POA: Diagnosis not present

## 2023-09-23 DIAGNOSIS — R351 Nocturia: Secondary | ICD-10-CM

## 2023-09-23 DIAGNOSIS — R058 Other specified cough: Secondary | ICD-10-CM

## 2023-09-23 DIAGNOSIS — R0689 Other abnormalities of breathing: Secondary | ICD-10-CM

## 2023-09-23 DIAGNOSIS — R0683 Snoring: Secondary | ICD-10-CM

## 2023-09-23 DIAGNOSIS — Z9189 Other specified personal risk factors, not elsewhere classified: Secondary | ICD-10-CM

## 2023-09-23 DIAGNOSIS — R519 Headache, unspecified: Secondary | ICD-10-CM

## 2023-09-23 NOTE — Progress Notes (Signed)
 Subjective:    Patient ID: Krista [place] is a 58 y.o. female.  HPI    Huston Foley, MD, PhD [facility] [address] [address] [place], [place] [address]  Dear Dr. Gerda Diss,  I saw your patient, Krista [place], upon your kind request, in m sleep clinic today for initial consultation of her sleep disorder, in particular, concern for underlying obstructive sleep apnea.  The patient is unaccompanied today.  As you know, Krista [place] is a 58 year old female with an underlying medical history of reflux disease, smoking, chronic cough, anxiety, depression, hypertension, seasonal allergies and obesity, who reports snoring and excessive daytime somnolence.  Her Epworth sleepiness score is 4 out of 24, fatigue severity score is 25/63.  I reviewed your office note from 09/06/2023. She has had a chronic cough for several months.  She has been sleeping in a recliner for that reason in the past month.  She shares her room with her daughter currently.  She lives with her mom and her 29 year old daughter.  She has a 41 year old son as well.  She is divorced.  She goes to bed generally between 10 and 10:30 PM and rise time is around 6:30 AM.  She is not aware of any family history of sleep apnea.  Her mom is 52 years old and her dad passed away young at age 81 from a heart attack.  She has nocturia about twice per average night.  She has occasionally woken up with a headache.  Her daughter has noticed gasping sounds while patient is asleep.  She has cut down on her caffeine intake.  She currently drinks 2 to 3 cups of coffee per day.  She drinks alcohol 2-3 times per month.  She smokes about half a pack to three quarters of a pack per day on average.  She is working on smoking cessation.  She works as a Restaurant manager, fast food.  She does not work from home.  She has been on Nexium in the past and is currently on generic Protonix daily.  They have 1 dog in the household, he does tend to sleep on the  bed.  Her Past Medical History Is Significant For: Past Medical History:  Diagnosis Date   Anxiety    Depression    Hypertension    No abnormality seen    Seasonal allergies     Her Past Surgical History Is Significant For: Past Surgical History:  Procedure Laterality Date   BIOPSY  04/15/2020   Procedure: BIOPSY;  Surgeon: Lanelle Bal, DO;  [facility];  Service: Endoscopy;;   CARPAL TUNNEL RELEASE  2004   left   CARPAL TUNNEL RELEASE Right 06/27/2013   Procedure: RIGHT CARPAL TUNNEL RELEASE;  Surgeon: Nicki Reaper, MD;  Location: [facility];  Service: Orthopedics;  Laterality: Right;   CESAREAN SECTION  2004   COLONOSCOPY WITH PROPOFOL N/A 04/15/2020   Procedure: COLONOSCOPY WITH PROPOFOL;  Surgeon: Lanelle Bal, DO;  [facility];  Service: Endoscopy;  Laterality: N/A;  10:00   ENDOMETRIAL ABLATION  2009   POLYPECTOMY  04/15/2020   Procedure: POLYPECTOMY;  Surgeon: Lanelle Bal, DO;  [facility];  Service: Endoscopy;;    Her Family History Is Significant For: Family History  Problem Relation Age of Onset   Heart attack Father    Colon cancer Other     Her Social History Is Significant For: Social History   Socioeconomic History  Marital status: Legally Separated    Spouse name: Not on file   Number of children: Not on file   Years of education: Not on file   Highest education level: Not on file  Occupational History   Not on file  Tobacco Use   Smoking status: Every Day    Current packs/day: 0.50    Types: Cigarettes   Smokeless tobacco: Never  Vaping Use   Vaping status: Never Used  Substance and Sexual Activity   Alcohol use: Yes    Comment: occ   Drug use: Never   Sexual activity: Not on file  Other Topics Concern   Not on file  Social History Narrative   Caffiene 2-3 cups daily (coffee)   Working:  Acupuncturist   Living mother, daughter, dog   Social Drivers of Manufacturing engineer Strain: Not on file  Food Insecurity: Not on file  Transportation Needs: Not on file  Physical Activity: Not on file  Stress: Not on file  Social Connections: Unknown (12/03/2021)   Received from [facility]   Social Network    Social Network: Not on file    Her Allergies Are:  No Known Allergies:   Her Current Medications Are:  Outpatient Encounter Medications as of 09/23/2023  Medication Sig   amlodipine-olmesartan (AZOR) 10-20 MG tablet TAKE 1 TABLET BY MOUTH EVERY DAY   B Complex Vitamins (BL VITAMIN B COMPLEX PO) Take 2 tablets by mouth daily.   FLUoxetine (PROZAC) 20 MG capsule Take 20 mg by mouth daily.    pantoprazole (PROTONIX) 40 MG tablet Take 1 tablet (40 mg total) by mouth daily as needed (GERD).   promethazine-dextromethorphan (PROMETHAZINE-DM) 6.25-15 MG/5ML syrup Take 5 mLs by mouth 4 (four) times daily as needed for cough.   rosuvastatin (CRESTOR) 10 MG tablet Take 1 tablet (10 mg total) by mouth daily.   Turmeric Curcumin 500 MG CAPS Take 500 mg by mouth daily.   No facility-administered encounter medications on file as of 09/23/2023.  :   Review of Systems:  Out of a complete 14 point review of systems, all are reviewed and negative with the exception of these symptoms as listed below:  Review of Systems  Neurological:         Daytime fatigue tiredness also feels rundown at times snores at night.  ESS 4, FSS 25.  Does not feel rested.      Objective:  Neurological Exam  Physical Exam Physical Examination:   Vitals:   09/23/23 1424  BP: 112/71  Pulse: 86    General Examination: The patient is a very pleasant 58 y.o. female in no acute distress. She appears well-developed and well-nourished and well groomed.   HEENT: Normocephalic, atraumatic, pupils are equal, round and reactive to light, extraocular tracking is good without limitation to gaze excursion or nystagmus noted. Hearing is grossly intact. Face is  symmetric with normal facial animation. Speech is clear with no dysarthria noted. There is no hypophonia. There is no lip, neck/head, jaw or voice tremor. Neck is supple with full range of passive and active motion. There are no carotid bruits on auscultation. Oropharynx exam reveals: mild mouth dryness, adequate dental hygiene and moderate airway crowding, due to small airway entry.  Tonsils are on the smaller side.  Mallampati class II.  Neck circumference 14-7/8 inches, no significant overbite.  Evidence of tooth grinding. Tongue protrudes centrally and palate elevates symmetrically.  Chest: Clear to auscultation without wheezing, rhonchi  or crackles noted.  Heart: S1+S2+0, regular and normal without murmurs, rubs or gallops noted.   Abdomen: Soft, non-tender and non-distended.  Extremities: There is no pitting edema in the distal lower extremities bilaterally.   Skin: Warm and dry without trophic changes noted.   Musculoskeletal: exam reveals no obvious joint deformities.   Neurologically:  Mental status: The patient is awake, alert and oriented in all 4 spheres. Her immediate and remote memory, attention, language skills and fund of knowledge are appropriate. There is no evidence of aphasia, agnosia, apraxia or anomia. Speech is clear with normal prosody and enunciation. Thought process is linear. Mood is normal and affect is normal.  Cranial nerves II - XII are as described above under HEENT exam.  Motor exam: Normal bulk, strength and tone is noted. There is no obvious action or resting tremor.  Fine motor skills and coordination: grossly intact.  Cerebellar testing: No dysmetria or intention tremor. There is no truncal or gait ataxia.  Sensory exam: intact to light touch in the upper and lower extremities.  Gait, station and balance: She stands easily. No veering to one side is noted. No leaning to one side is noted. Posture is age-appropriate and stance is narrow based. Gait shows  normal stride length and normal pace. No problems turning are noted.   Assessment and Plan:  In summary, Krista [place] is a very pleasant 58 y.o.-year old female with an underlying medical history of reflux disease, smoking, chronic cough, anxiety, depression, hypertension, seasonal allergies and obesity, whose history and physical exam are concerning for sleep disordered breathing, particularly obstructive sleep apnea (OSA). A laboratory attended sleep study is typically considered "gold standard" for evaluation of sleep disordered breathing.   I had a long chat with the patient about my findings and the diagnosis of sleep apnea, particularly OSA, its prognosis and treatment options. We talked about medical/conservative treatments, surgical interventions and non-pharmacological approaches for symptom control. I explained, in particular, the risks and ramifications of untreated moderate to severe OSA, especially with respect to developing cardiovascular disease down the road, including congestive heart failure (CHF), difficult to treat hypertension, cardiac arrhythmias (particularly A-fib), neurovascular complications including TIA, stroke and dementia. Even type 2 diabetes has, in part, been linked to untreated OSA. Symptoms of untreated OSA may include (but may not be limited to) daytime sleepiness, nocturia (i.e. frequent nighttime urination), memory problems, mood irritability and suboptimally controlled or worsening mood disorder such as depression and/or anxiety, lack of energy, lack of motivation, physical discomfort, as well as recurrent headaches, especially morning or nocturnal headaches. We talked about the importance of maintaining a healthy lifestyle and striving for healthy weight.  The importance of complete smoking cessation was also addressed.  In addition, we talked about the importance of striving for and maintaining good sleep hygiene. I recommended a sleep study at this time. I outlined  the differences between a laboratory attended sleep study which is considered more comprehensive and accurate over the option of a home sleep test (HST); the latter may lead to underestimation of sleep disordered breathing in some instances and does not help with diagnosing upper airway resistance syndrome and is not accurate enough to diagnose primary central sleep apnea typically. I outlined possible surgical and non-surgical treatment options of OSA, including the use of a positive airway pressure (PAP) device (i.e. CPAP, AutoPAP/APAP or BiPAP in certain circumstances), a custom-made dental device (aka oral appliance, which would require a referral to a specialist dentist or orthodontist typically,  and is generally speaking not considered for patients with full dentures or edentulous state), upper airway surgical options, such as traditional UPPP (which is not considered a first-line treatment) or the Inspire device (hypoglossal nerve stimulator, which would involve a referral for consultation with an ENT surgeon, after careful selection, following inclusion criteria - also not first-line treatment). I explained the PAP treatment option to the patient in detail, as this is generally considered first-line treatment.  The patient indicated that she would be willing to try PAP therapy, if the need arises. I explained the importance of being compliant with PAP treatment, not only for insurance purposes but primarily to improve patient's symptoms symptoms, and for the patient's long term health benefit, including to reduce Her cardiovascular risks longer-term.    We will pick up our discussion about the next steps and treatment options after testing.  We will keep her posted as to the test results by phone call and/or MyChart messaging where possible.  We will plan to follow-up in sleep clinic accordingly as well.  I answered all her questions today and the patient was in agreement.   I encouraged her to call  with any interim questions, concerns, problems or updates or email [place] through MyChart.  Generally speaking, sleep test authorizations may take up to 2 weeks, sometimes less, sometimes longer, the patient is encouraged to get in touch with [place] if they do not hear back from the sleep lab staff directly within the next 2 weeks.  Thank you very much for allowing me to participate in the care of this nice patient. If I can be of any further assistance to you please do not hesitate to call me at [phone].  Sincerely,   Huston Foley, MD, PhD

## 2023-09-23 NOTE — Patient Instructions (Signed)

## 2023-10-19 ENCOUNTER — Ambulatory Visit: Admitting: Neurology

## 2023-10-19 DIAGNOSIS — G4733 Obstructive sleep apnea (adult) (pediatric): Secondary | ICD-10-CM | POA: Diagnosis not present

## 2023-10-19 DIAGNOSIS — R0689 Other abnormalities of breathing: Secondary | ICD-10-CM

## 2023-10-19 DIAGNOSIS — R058 Other specified cough: Secondary | ICD-10-CM

## 2023-10-19 DIAGNOSIS — R0683 Snoring: Secondary | ICD-10-CM

## 2023-10-19 DIAGNOSIS — R351 Nocturia: Secondary | ICD-10-CM

## 2023-10-19 DIAGNOSIS — G4734 Idiopathic sleep related nonobstructive alveolar hypoventilation: Secondary | ICD-10-CM

## 2023-10-19 DIAGNOSIS — E66811 Obesity, class 1: Secondary | ICD-10-CM

## 2023-10-19 DIAGNOSIS — Z9189 Other specified personal risk factors, not elsewhere classified: Secondary | ICD-10-CM

## 2023-10-19 DIAGNOSIS — G4719 Other hypersomnia: Secondary | ICD-10-CM

## 2023-10-19 DIAGNOSIS — R519 Headache, unspecified: Secondary | ICD-10-CM

## 2023-10-20 NOTE — Progress Notes (Signed)
 See procedure note.

## 2023-10-21 NOTE — Addendum Note (Signed)
 Addended by: Shauntelle Jamerson on: 10/21/2023 11:52 AM   Modules accepted: Orders

## 2023-10-21 NOTE — Procedures (Signed)
   [facility]  HOME SLEEP TEST (Watch PAT) REPORT  STUDY DATE: 10/19/2023  DOB: [birth year]  MRN: [id number]  ORDERING CLINICIAN: Debbra Fairy, MD, PhD   REFERRING CLINICIAN: Dr. Fairy Homer  CLINICAL INFORMATION/HISTORY: 58 year old female with an underlying medical history of reflux disease, smoking, chronic cough, anxiety, depression, hypertension, seasonal allergies and obesity, who reports snoring and excessive daytime somnolence.   Epworth sleepiness score: 4/24.  BMI: 31 kg/m  FINDINGS:   Sleep Summary:   Total Recording Time (hours, min): 7 hours, 57 min  Total Sleep Time (hours, min):  6 hours, 46 min  Percent REM (%):    28.6%   Respiratory Indices:   Calculated pAHI (per hour):  41.3/hour         REM pAHI:    62.2/hour       NREM pAHI: 33/hour  Central pAHI: 0.8/hour  Oxygen Saturation Statistics:    Oxygen Saturation (%) Mean: 91%   Minimum oxygen saturation (%):                 80%   O2 Saturation Range (%): 80-97%    O2 Saturation (minutes) <=88%: 26.1 min  Pulse Rate Statistics:   Pulse Mean (bpm):    75/min    Pulse Range (60-106/min)   IMPRESSION:   OSA (obstructive sleep apnea), severe Nocturnal Hypoxemia  RECOMMENDATION:  This home sleep test demonstrates severe obstructive sleep apnea with a total AHI of 41.3/hour and O2 nadir of 80% with significant time below or at 88% saturation of over over 25 minutes for the study, indicating nocturnal hypoxemia. Snoring was detected, in the moderate to loud range.  Urgent treatment with positive airway pressure is highly recommended. The patient will be advised to proceed with an autoPAP titration/trial at home. A laboratory attended titration study can be considered in the future for optimization of treatment settings and to improve tolerance and compliance, if needed, down the road. Alternative treatment options are limited secondary to the severity of the patient's sleep disordered  breathing, but may include surgical treatment with an implantable hypoglossal nerve stimulator (in carefully selected candidates, meeting criteria).  Concomitant weight loss is recommended (where clinically appropriate). Please note, that untreated obstructive sleep apnea may carry additional perioperative morbidity. Patients with significant obstructive sleep apnea should receive perioperative PAP therapy and the surgeons and particularly the anesthesiologist should be informed of the diagnosis and the severity of the sleep disordered breathing. The patient should be cautioned not to drive, work at heights, or operate dangerous or heavy equipment when tired or sleepy. Review and reiteration of good sleep hygiene measures should be pursued with any patient. Other causes of the patient's symptoms, including circadian rhythm disturbances, an underlying mood disorder, medication effect and/or an underlying medical problem cannot be ruled out based on this test. Clinical correlation is recommended.  The patient and her referring provider will be notified of the test results. The patient will be seen in follow up in sleep clinic at [facility].  I certify that I have reviewed the raw data recording prior to the issuance of this report in accordance with the standards of the American Academy of Sleep Medicine (AASM).    INTERPRETING PHYSICIAN:   Debbra Fairy, MD, PhD Medical Director, [facility]) Diplomat, ABPN (Neurology and Sleep)   [facility] [address], [address] [place], [place] [address] [phone]

## 2023-10-25 ENCOUNTER — Telehealth: Payer: Self-pay | Admitting: *Deleted

## 2023-10-25 NOTE — Telephone Encounter (Signed)
 Spoke to pt and relayed her results of HST of SEVERE OSA.  Recommend autopap , no additional testing needing at this time.   Recommend autopap.  Instructed to use 4 + hrs every night.  DME ( [facility] ) sent order to them.  They will authorize then call her.  She had questions about her insurance will speak to them (her next plan (calendar) will be end of May).  They will instruct on use, supplies, mask/ cleaning.  We ill see back 2-3 months for insurance compliance appt.  She verbalized understanding.  [facility] sent message (new order).

## 2023-10-25 NOTE — Telephone Encounter (Signed)
 RE: new autopap user severe OSA Received: Today Zott, Nicky Barrack, RN; Josefine Nice Got It Thank You       Previous Messages    ----- Message ----- From: Viktoria Gray, RN Sent: 10/25/2023   3:23 PM EDT To: Russel Courser Zott Subject: new autopap user severe OSA                    New order in epic,  Krista [place] Female, 57 y.o., Nov 10, 1965 MRN: [id number] Phone: [phone]   Severe osa.  Thanks Sudley

## 2023-10-25 NOTE — Telephone Encounter (Signed)
-----   Message from Debbra Fairy sent at 10/21/2023 11:52 AM EDT ----- Urgent set up requested on PAP therapy, due to severe OSA. Patient referred by Dr. Fairy Homer, seen by me on 09/23/2023, patient had a HST on 10/19/2023.    Please call and notify the patient that the recent home sleep test showed obstructive sleep apnea in the severe range. I recommend treatment for this in the form of autoPAP, which means, that we don't have to bring her in for a sleep study with CPAP, but will let her start using a so called autoPAP machine at home, through a DME company (of her choice, or as per insurance requirement). The DME representative will fit the patient with a mask of choice, educate her on how to use the machine, how to put the mask on, etc. I have placed an order in the chart. Please send the order to a local DME, talk to patient, send report to referring MD. Please also reinforce the need for compliance with treatment. We will need a FU in sleep clinic for 10 weeks post-PAP set up, please arrange that with me or one of our NPs. Thanks,   Debbra Fairy, MD, PhD [facility])

## 2023-11-08 DIAGNOSIS — G4733 Obstructive sleep apnea (adult) (pediatric): Secondary | ICD-10-CM | POA: Diagnosis not present

## 2023-11-16 ENCOUNTER — Telehealth: Payer: Self-pay | Admitting: *Deleted

## 2023-11-23 NOTE — Telephone Encounter (Signed)
 Mailed letter to pt about her initial cpap appt.

## 2023-12-09 DIAGNOSIS — G4733 Obstructive sleep apnea (adult) (pediatric): Secondary | ICD-10-CM | POA: Diagnosis not present

## 2023-12-29 DIAGNOSIS — Z01419 Encounter for gynecological examination (general) (routine) without abnormal findings: Secondary | ICD-10-CM | POA: Diagnosis not present

## 2023-12-29 DIAGNOSIS — Z1231 Encounter for screening mammogram for malignant neoplasm of breast: Secondary | ICD-10-CM | POA: Diagnosis not present

## 2023-12-29 DIAGNOSIS — F411 Generalized anxiety disorder: Secondary | ICD-10-CM | POA: Diagnosis not present

## 2024-01-08 DIAGNOSIS — G4733 Obstructive sleep apnea (adult) (pediatric): Secondary | ICD-10-CM | POA: Diagnosis not present

## 2024-01-26 NOTE — Progress Notes (Unsigned)
 SABRA

## 2024-01-27 ENCOUNTER — Encounter: Payer: Self-pay | Admitting: Adult Health

## 2024-01-27 ENCOUNTER — Telehealth: Admitting: Adult Health

## 2024-01-27 DIAGNOSIS — G4733 Obstructive sleep apnea (adult) (pediatric): Secondary | ICD-10-CM | POA: Diagnosis not present

## 2024-01-31 NOTE — Progress Notes (Signed)
 Krista [place] D, CMA  Zott, Gasper Ona, Tammy; Darrel Boyer New orders have been placed for the above pt, DOB: 01/04/1966 Thanks

## 2024-02-08 DIAGNOSIS — G4733 Obstructive sleep apnea (adult) (pediatric): Secondary | ICD-10-CM | POA: Diagnosis not present

## 2024-02-12 ENCOUNTER — Other Ambulatory Visit: Payer: Self-pay | Admitting: Family Medicine

## 2024-02-14 ENCOUNTER — Other Ambulatory Visit: Payer: Self-pay

## 2024-02-14 MED ORDER — AMLODIPINE-OLMESARTAN 10-20 MG PO TABS: 1.0000 | ORAL_TABLET | Freq: Every day | ORAL | 1 refills | Status: DC

## 2024-02-29 ENCOUNTER — Other Ambulatory Visit: Payer: Self-pay | Admitting: Family Medicine

## 2024-02-29 MED ORDER — AMLODIPINE-OLMESARTAN 10-20 MG PO TABS: 1.0000 | ORAL_TABLET | Freq: Every day | ORAL | 1 refills | Status: AC

## 2024-02-29 NOTE — Telephone Encounter (Signed)
 Copied from CRM [id number]. Topic: Clinical - Medication Refill >> Feb 29, 2024 10:18 AM Rosaria E wrote: Medication:  amlodipine -olmesartan  (AZOR ) 10-20 MG tablet   Has the patient contacted their pharmacy? Yes (Agent: If no, request that the patient contact the pharmacy for the refill. If patient does not wish to contact the pharmacy document the reason why and proceed with request.) (Agent: If yes, when and what did the pharmacy advise?)  This is the patient's preferred pharmacy:   [id number] [address] [facility] - [address] [place] [address] Phone: [phone] Fax: [phone]   Is this the correct pharmacy for this prescription? Yes If no, delete pharmacy and type the correct one.   Has the prescription been filled recently? Yes  Is the patient out of the medication? Yes  Has the patient been seen for an appointment in the last year OR does the patient have an upcoming appointment? Yes  Can we respond through MyChart? Yes  Agent: Please be advised that Rx refills may take up to 3 business days. We ask that you follow-up with your pharmacy.

## 2024-03-02 DIAGNOSIS — H04123 Dry eye syndrome of bilateral lacrimal glands: Secondary | ICD-10-CM | POA: Diagnosis not present

## 2024-03-06 ENCOUNTER — Ambulatory Visit: Admitting: Family Medicine

## 2024-03-06 VITALS — BP 121/76 | HR 87 | Ht 65.5 in | Wt 182.0 lb

## 2024-03-06 DIAGNOSIS — E782 Mixed hyperlipidemia: Secondary | ICD-10-CM | POA: Diagnosis not present

## 2024-03-06 DIAGNOSIS — Z130 Encounter for screening for diseases of the blood and blood-forming organs and certain disorders involving the immune mechanism: Secondary | ICD-10-CM

## 2024-03-06 DIAGNOSIS — I10 Essential (primary) hypertension: Secondary | ICD-10-CM

## 2024-03-06 DIAGNOSIS — Z720 Tobacco use: Secondary | ICD-10-CM

## 2024-03-06 DIAGNOSIS — G4733 Obstructive sleep apnea (adult) (pediatric): Secondary | ICD-10-CM | POA: Insufficient documentation

## 2024-03-06 DIAGNOSIS — K219 Gastro-esophageal reflux disease without esophagitis: Secondary | ICD-10-CM

## 2024-03-06 MED ORDER — ROSUVASTATIN CALCIUM 10 MG PO TABS: 10.0000 mg | ORAL_TABLET | Freq: Every day | ORAL | 3 refills | Status: AC

## 2024-03-06 MED ORDER — PANTOPRAZOLE SODIUM 40 MG PO TBEC: 40.0000 mg | DELAYED_RELEASE_TABLET | Freq: Every day | ORAL | 3 refills | Status: AC | PRN

## 2024-03-06 NOTE — Patient Instructions (Signed)
Follow up in 6 months.  Take care  Dr. Wren Gallaga  

## 2024-03-06 NOTE — Assessment & Plan Note (Signed)
 Stable

## 2024-03-06 NOTE — Progress Notes (Signed)
 Subjective:  Patient ID: Krista Davis, female    DOB: Dec 13, 1965  Age: 58 y.o. MRN: [id number]  CC:   Chief Complaint  Patient presents with   Medical Management of Chronic Issues    HPI:  58 year old female with a below mentioned past medical history presents for follow-up.  Patient states she is doing well.  No complaints today.  Denies chest pain, shortness of breath.  Hypertension stable on amlodipine /olmesartan .  Hyperlipidemia has been at goal on Crestor .  She is tolerating well.  Patient recently diagnosed with obstructive sleep apnea.  She is compliant with CPAP.  Patient reports that she is on compounded semaglutide through [facility].  She continues to smoke.  She is not interested in cessation.  She states that she will get her flu vaccine at work.  Declines pneumococcal vaccine.  States that her Pap smear and mammogram screenings are up-to-date as they are done through her OB/GYN.  Need records.  Patient Active Problem List   Diagnosis Date Noted   OSA (obstructive sleep apnea) 03/06/2024   Cervical radiculopathy 02/15/2023   Tobacco abuse 12/20/2022   Mixed hyperlipidemia 12/15/2021   Essential hypertension 12/15/2021   GERD (gastroesophageal reflux disease) 12/15/2021   Chronic sinusitis 01/06/2021   Hyperplastic colon polyp 04/19/2020    Social Hx   Social History   Socioeconomic History   Marital status: Legally Separated    Spouse name: Not on file   Number of children: Not on file   Years of education: Not on file   Highest education level: Not on file  Occupational History   Not on file  Tobacco Use   Smoking status: Every Day    Current packs/day: 0.50    Types: Cigarettes   Smokeless tobacco: Never  Vaping Use   Vaping status: Never Used  Substance and Sexual Activity   Alcohol use: Yes    Comment: occ   Drug use: Never   Sexual activity: Not on file  Other Topics Concern   Not on file  Social History Narrative   Caffiene 2-3 cups  daily (coffee)   Working:  Acupuncturist   Living mother, daughter, dog   Social Drivers of Corporate investment banker Strain: Not on file  Food Insecurity: Not on file  Transportation Needs: Not on file  Physical Activity: Not on file  Stress: Not on file  Social Connections: Unknown (12/03/2021)   Received from [employer]   Social Network    Social Network: Not on file    Review of Systems Per HPI  Objective:  BP 121/76   Pulse 87   Ht 5' 5.5 (1.664 m)   Wt 182 lb (82.6 kg)   BMI 29.83 kg/m      03/06/2024    8:12 AM 09/23/2023    2:24 PM 09/06/2023    1:53 PM  BP/Weight  Systolic BP 121 112 128  Diastolic BP 76 71 72  Wt. (Lbs) 182 189.4 186  BMI 29.83 kg/m2 31.04 kg/m2 36.33 kg/m2    Physical Exam Vitals and nursing note reviewed.  Constitutional:      General: She is not in acute distress.    Appearance: Normal appearance.  HENT:     Head: Normocephalic and atraumatic.  Eyes:     General:        Right eye: No discharge.        Left eye: No discharge.     Conjunctiva/sclera: Conjunctivae normal.  Cardiovascular:  Rate and Rhythm: Normal rate and regular rhythm.  Pulmonary:     Effort: Pulmonary effort is normal.     Breath sounds: Normal breath sounds. No wheezing, rhonchi or rales.  Neurological:     Mental Status: She is alert.  Psychiatric:        Mood and Affect: Mood normal.        Behavior: Behavior normal.     Lab Results  Component Value Date   WBC 7.4 02/12/2023   HGB 14.0 02/12/2023   HCT 41.4 02/12/2023   PLT 283 02/12/2023   GLUCOSE 100 (H) 02/12/2023   CHOL 171 02/12/2023   TRIG 305 (H) 02/12/2023   HDL 54 02/12/2023   LDLCALC 69 02/12/2023   ALT 17 02/12/2023   AST 16 02/12/2023   NA 139 02/12/2023   K 4.8 02/12/2023   CL 102 02/12/2023   CREATININE 0.65 02/12/2023   BUN 13 02/12/2023   CO2 22 02/12/2023   TSH 2.200 01/11/2020     Assessment & Plan:  Essential hypertension Assessment & Plan: Stable.  Continue Azor . Labs today.  Orders: -     CMP14+EGFR -     Microalbumin / creatinine urine ratio  OSA (obstructive sleep apnea) Assessment & Plan: New diagnosis. On CPAP.   Mixed hyperlipidemia Assessment & Plan: Stable on Crestor .  Continue.  Orders: -     Lipid panel -     Rosuvastatin  Calcium ; Take 1 tablet (10 mg total) by mouth daily.  Dispense: 90 tablet; Refill: 3  Screening for deficiency anemia -     CBC  Gastroesophageal reflux disease without esophagitis Assessment & Plan: Stable.  Orders: -     Pantoprazole  Sodium; Take 1 tablet (40 mg total) by mouth daily as needed (GERD).  Dispense: 90 tablet; Refill: 3  Tobacco abuse Assessment & Plan: Not ready to quit.  We discussed today.     Follow-up:  6 months  Benito Lemmerman Bluford DO [facility]

## 2024-03-06 NOTE — Assessment & Plan Note (Signed)
 Stable. Continue Azor . Labs today.

## 2024-03-06 NOTE — Assessment & Plan Note (Signed)
Stable on Crestor.  Continue. 

## 2024-03-06 NOTE — Assessment & Plan Note (Signed)
 New diagnosis. On CPAP.

## 2024-03-06 NOTE — Assessment & Plan Note (Signed)
 Not ready to quit.  We discussed today.

## 2024-03-07 ENCOUNTER — Ambulatory Visit: Payer: Self-pay | Admitting: Family Medicine

## 2024-03-07 LAB — CMP14+EGFR
ALT: 16 IU/L (ref 0–32)
AST: 16 IU/L (ref 0–40)
Albumin: 4.8 g/dL (ref 3.8–4.9)
Alkaline Phosphatase: 90 IU/L (ref 44–121)
BUN/Creatinine Ratio: 13 (ref 9–23)
BUN: 9 mg/dL (ref 6–24)
Bilirubin Total: 0.4 mg/dL (ref 0.0–1.2)
CO2: 19 mmol/L — ABNORMAL LOW (ref 20–29)
Calcium: 9.9 mg/dL (ref 8.7–10.2)
Chloride: 106 mmol/L (ref 96–106)
Creatinine, Ser: 0.68 mg/dL (ref 0.57–1.00)
Globulin, Total: 2.2 g/dL (ref 1.5–4.5)
Glucose: 95 mg/dL (ref 70–99)
Potassium: 4.5 mmol/L (ref 3.5–5.2)
Sodium: 141 mmol/L (ref 134–144)
Total Protein: 7 g/dL (ref 6.0–8.5)
eGFR: 101 mL/min/1.73 (ref >59)

## 2024-03-07 LAB — MICROALBUMIN / CREATININE URINE RATIO
Creatinine, Urine: 78.5 mg/dL
Microalb/Creat Ratio: 8 mg/g{creat} (ref 0–29)
Microalbumin, Urine: 5.9 ug/mL

## 2024-03-07 LAB — LIPID PANEL
Chol/HDL Ratio: 2.7 ratio (ref 0.0–4.4)
Cholesterol, Total: 143 mg/dL (ref 100–199)
HDL: 53 mg/dL (ref >39)
LDL Chol Calc (NIH): 68 mg/dL (ref 0–99)
Triglycerides: 127 mg/dL (ref 0–149)
VLDL Cholesterol Cal: 22 mg/dL (ref 5–40)

## 2024-03-07 LAB — CBC
Hematocrit: 45.3 % (ref 34.0–46.6)
Hemoglobin: 14.7 g/dL (ref 11.1–15.9)
MCH: 30.8 pg (ref 26.6–33.0)
MCHC: 32.5 g/dL (ref 31.5–35.7)
MCV: 95 fL (ref 79–97)
Platelets: 253 x10E3/uL (ref 150–450)
RBC: 4.78 x10E6/uL (ref 3.77–5.28)
RDW: 12.7 % (ref 11.7–15.4)
WBC: 5.3 x10E3/uL (ref 3.4–10.8)

## 2024-09-04 ENCOUNTER — Ambulatory Visit: Admitting: Family Medicine
